# Patient Record
Sex: Female | Born: 1935 | Race: White | Hispanic: No | Smoking: Never smoker
Health system: Southern US, Community
[De-identification: ages and names within clinical notes are randomized; demographics above are authoritative.]

## PROBLEM LIST (undated history)

## (undated) DIAGNOSIS — G500 Trigeminal neuralgia: Secondary | ICD-10-CM

## (undated) DIAGNOSIS — M1990 Unspecified osteoarthritis, unspecified site: Secondary | ICD-10-CM

## (undated) DIAGNOSIS — N390 Urinary tract infection, site not specified: Secondary | ICD-10-CM

## (undated) DIAGNOSIS — F0390 Unspecified dementia without behavioral disturbance: Secondary | ICD-10-CM

## (undated) DIAGNOSIS — F0280 Dementia in other diseases classified elsewhere without behavioral disturbance: Secondary | ICD-10-CM

## (undated) DIAGNOSIS — I4891 Unspecified atrial fibrillation: Secondary | ICD-10-CM

## (undated) DIAGNOSIS — G309 Alzheimer's disease, unspecified: Secondary | ICD-10-CM

## (undated) HISTORY — PX: ABDOMINAL HYSTERECTOMY: SHX81

## (undated) HISTORY — PX: OTHER SURGICAL HISTORY: SHX169

## (undated) HISTORY — PX: BACK SURGERY: SHX140

---

## 1998-05-14 ENCOUNTER — Other Ambulatory Visit: Admission: RE | Admit: 1998-05-14 | Discharge: 1998-05-14 | Payer: Self-pay | Admitting: Internal Medicine

## 1999-08-13 ENCOUNTER — Ambulatory Visit (HOSPITAL_BASED_OUTPATIENT_CLINIC_OR_DEPARTMENT_OTHER): Admission: RE | Admit: 1999-08-13 | Discharge: 1999-08-13 | Payer: Self-pay | Admitting: Plastic Surgery

## 1999-09-30 ENCOUNTER — Encounter: Admission: RE | Admit: 1999-09-30 | Discharge: 1999-09-30 | Payer: Self-pay | Admitting: Family Medicine

## 1999-09-30 ENCOUNTER — Encounter: Payer: Self-pay | Admitting: Family Medicine

## 2001-03-10 ENCOUNTER — Encounter: Payer: Self-pay | Admitting: Gastroenterology

## 2001-03-10 ENCOUNTER — Encounter: Admission: RE | Admit: 2001-03-10 | Discharge: 2001-03-10 | Payer: Self-pay | Admitting: Gastroenterology

## 2001-03-24 ENCOUNTER — Encounter: Admission: RE | Admit: 2001-03-24 | Discharge: 2001-03-24 | Payer: Self-pay | Admitting: Family Medicine

## 2001-03-24 ENCOUNTER — Encounter: Payer: Self-pay | Admitting: Family Medicine

## 2001-04-09 ENCOUNTER — Encounter (INDEPENDENT_AMBULATORY_CARE_PROVIDER_SITE_OTHER): Payer: Self-pay | Admitting: *Deleted

## 2001-04-09 ENCOUNTER — Ambulatory Visit (HOSPITAL_COMMUNITY): Admission: RE | Admit: 2001-04-09 | Discharge: 2001-04-09 | Payer: Self-pay | Admitting: Gastroenterology

## 2003-08-01 ENCOUNTER — Encounter: Admission: RE | Admit: 2003-08-01 | Discharge: 2003-08-01 | Payer: Self-pay | Admitting: Family Medicine

## 2003-08-09 ENCOUNTER — Encounter: Admission: RE | Admit: 2003-08-09 | Discharge: 2003-08-09 | Payer: Self-pay | Admitting: Family Medicine

## 2003-12-14 ENCOUNTER — Emergency Department (HOSPITAL_COMMUNITY): Admission: EM | Admit: 2003-12-14 | Discharge: 2003-12-14 | Payer: Self-pay | Admitting: Family Medicine

## 2004-01-30 ENCOUNTER — Encounter: Admission: RE | Admit: 2004-01-30 | Discharge: 2004-01-30 | Payer: Self-pay | Admitting: Family Medicine

## 2004-09-11 ENCOUNTER — Encounter: Admission: RE | Admit: 2004-09-11 | Discharge: 2004-09-11 | Payer: Self-pay | Admitting: Family Medicine

## 2005-10-27 ENCOUNTER — Encounter: Admission: RE | Admit: 2005-10-27 | Discharge: 2005-10-27 | Payer: Self-pay | Admitting: Family Medicine

## 2007-04-03 ENCOUNTER — Emergency Department (HOSPITAL_COMMUNITY): Admission: EM | Admit: 2007-04-03 | Discharge: 2007-04-03 | Payer: Self-pay | Admitting: Emergency Medicine

## 2009-10-17 ENCOUNTER — Emergency Department (HOSPITAL_COMMUNITY): Admission: EM | Admit: 2009-10-17 | Discharge: 2009-10-17 | Payer: Self-pay | Admitting: Family Medicine

## 2010-07-22 ENCOUNTER — Emergency Department (HOSPITAL_COMMUNITY)
Admission: EM | Admit: 2010-07-22 | Discharge: 2010-07-22 | Payer: Self-pay | Source: Home / Self Care | Admitting: Family Medicine

## 2010-10-14 LAB — CBC
HCT: 32.5 % — ABNORMAL LOW (ref 36.0–46.0)
Hemoglobin: 11 g/dL — ABNORMAL LOW (ref 12.0–15.0)
RBC: 3.44 MIL/uL — ABNORMAL LOW (ref 3.87–5.11)
WBC: 6.6 10*3/uL (ref 4.0–10.5)

## 2010-12-06 NOTE — Procedures (Signed)
[place]. [facility]  Patient:    Jodi Beltran, Jodi Beltran Visit Number: [id number] MRN: [id number]          Service Type: END Location: ENDO Attending Physician:  Charna Elizabeth Dictated by:   Anselmo Rod, M.D. Proc. Date: 04/09/01 Admit Date:  04/09/2001   CC:         Dewain Penning, M.D.   Procedure Report  DATE OF BIRTH:  November 09, 1920  PROCEDURE PERFORMED:  Esophagogastroduodenoscopy with biopsies.  ENDOSCOPIST:  Anselmo Rod, M.D.  INSTRUMENT USED:  Olympus video panendoscope.  INDICATIONS FOR PROCEDURE:  Abnormal weight loss and abdominal pain in a 77 year old white female, rule out peptic ulcer disease, esophagitis, gastritis, etc.  PREPROCEDURE PREPARATION:  Informed consent was obtained from the patient. The patient was fasted for eight hours prior to the procedure.  PREPROCEDURE PHYSICAL EXAMINATION:  VITAL SIGNS:  Stable.  NECK:  Supple.  CHEST:  Clear to auscultation.  HEART:  S1 and S2 regular.  ABDOMEN:  Soft with normal bowel sounds.  DESCRIPTION OF PROCEDURE:  The patient was placed in the left lateral decubitus position and sedated with 60 mg of Demerol and 7 mg of Versed intravenously.  Once the patient was adequately sedated and maintained on low flow oxygen and continuous cardiac monitoring, the Olympus video panendoscope was advanced through the mouth piece over the tongue into the esophagus, and under direct vision the entire esophagus appeared normal without evidence of rings, strictures, masses, lesions, esophagitis, or Barretts mucosa.  The scope was then advanced in the stomach.  There was some nodularity seen at the GE junction that was biopsied.  There was some easy bleeding after two biopsies, and therefore no further biopsies were taken, suspecting this may be a ______.  The rest of the gastric mucosa and the proximal small bowel appeared normal.  IMPRESSION:  Essentially normal esophagogastroduodenoscopy  except for slight mucosal change at the GE junction, biopsied for pathology.  Two biopsies done. Some bleeding, which seemed to be controlled before the procedure was completed.  RECOMMENDATIONS:  Proceed with colonoscopy at this time. Dictated by:   Anselmo Rod, M.D. Attending Physician:  Charna Elizabeth DD:  04/09/01 TD:  04/09/01 Job: [id number]

## 2010-12-06 NOTE — Procedures (Signed)
[place]. [facility]  Patient:    GABRIAL, POPPELL Visit Number: [id number] MRN: [id number]          Service Type: END Location: ENDO Attending Physician:  Charna Elizabeth Dictated by:   Anselmo Rod, M.D. Proc. Date: 04/09/01 Admit Date:  04/09/2001   CC:         Tammy R. Collins Scotland, M.D.   Procedure Report  DATE OF BIRTH:  1921/04/20.  PROCEDURE:  Colonoscopy.  ENDOSCOPIST:  Anselmo Rod, M.D.  INSTRUMENT USED:  Olympus video colonoscope.  INDICATION FOR PROCEDURE:  Abnormal weight loss with left lower quadrant pain in a 77 year old white female.  Rule out colonic polyps, masses, hemorrhoids, etc.  PREPROCEDURE PREPARATION:  Informed consent was procured from the patient. The patient was fasted for eight hours prior to the procedure and prepped with a bottle of magnesium citrate and a gallon of NuLytely the night prior to the procedure.  PREPROCEDURE PHYSICAL:  VITAL SIGNS:  The patient had stable vital signs.  NECK:  Supple.  CHEST:  Clear to auscultation.  S1, S2 regular.  ABDOMEN:  Soft with normal bowel sounds.  DESCRIPTION OF PROCEDURE:  The patient was placed in the left lateral decubitus position and sedated with an additional 20 mg of Demerol and 1 mg of Versed intravenously.  Once the patient was adequately sedate and maintained on low-flow oxygen and continuous cardiac monitoring, the Olympus video colonoscope was advanced from the rectum to the cecum without difficulty. There was some residual stool in the colon.  The procedure was completed with ease.  There were left-sided diverticula present.  The appendiceal orifice and the ileocecal valve were clearly visualized and photographed.  No masses or polyps were seen.  There was no evidence of hemorrhoidal disease.  IMPRESSION:  Left-sided diverticulosis, otherwise unrevealing colonoscopy.  RECOMMENDATIONS: 1. CT scan of the abdomen and pelvis will be done if the  patient continues to    lose weight. 2. A small bowel follow-through will be considered at next office visit and    further recommendations made as deemed appropriate. Dictated by:   Anselmo Rod, M.D. Attending Physician:  Charna Elizabeth DD:  04/09/01 TD:  04/09/01 Job: [id number]

## 2012-09-13 ENCOUNTER — Emergency Department (HOSPITAL_COMMUNITY)
Admission: EM | Admit: 2012-09-13 | Discharge: 2012-09-13 | Disposition: A | Discharge status: Home / Self Care | Payer: Medicare Other | Attending: Emergency Medicine | Admitting: Emergency Medicine

## 2012-09-13 ENCOUNTER — Encounter (HOSPITAL_COMMUNITY): Payer: Self-pay | Admitting: *Deleted

## 2012-09-13 ENCOUNTER — Emergency Department (HOSPITAL_COMMUNITY): Payer: Medicare Other

## 2012-09-13 DIAGNOSIS — R4781 Slurred speech: Secondary | ICD-10-CM

## 2012-09-13 DIAGNOSIS — Z8669 Personal history of other diseases of the nervous system and sense organs: Secondary | ICD-10-CM | POA: Insufficient documentation

## 2012-09-13 DIAGNOSIS — R1310 Dysphagia, unspecified: Secondary | ICD-10-CM | POA: Insufficient documentation

## 2012-09-13 DIAGNOSIS — F0390 Unspecified dementia without behavioral disturbance: Secondary | ICD-10-CM | POA: Insufficient documentation

## 2012-09-13 DIAGNOSIS — J020 Streptococcal pharyngitis: Secondary | ICD-10-CM | POA: Insufficient documentation

## 2012-09-13 DIAGNOSIS — R4789 Other speech disturbances: Secondary | ICD-10-CM | POA: Insufficient documentation

## 2012-09-13 HISTORY — DX: Unspecified dementia, unspecified severity, without behavioral disturbance, psychotic disturbance, mood disturbance, and anxiety: F03.90

## 2012-09-13 HISTORY — DX: Trigeminal neuralgia: G50.0

## 2012-09-13 LAB — COMPREHENSIVE METABOLIC PANEL
Alkaline Phosphatase: 113 U/L (ref 39–117)
BUN: 17 mg/dL (ref 6–23)
Creatinine, Ser: 0.83 mg/dL (ref 0.50–1.10)
GFR calc Af Amer: 70 mL/min — ABNORMAL LOW (ref >90)
Glucose, Bld: 120 mg/dL — ABNORMAL HIGH (ref 70–99)
Potassium: 3.7 mEq/L (ref 3.5–5.1)
Total Protein: 7.3 g/dL (ref 6.0–8.3)

## 2012-09-13 LAB — CBC WITH DIFFERENTIAL/PLATELET
Eosinophils Absolute: 0 10*3/uL (ref 0.0–0.7)
Eosinophils Relative: 0 % (ref 0–5)
HCT: 35.3 % — ABNORMAL LOW (ref 36.0–46.0)
Hemoglobin: 12.2 g/dL (ref 12.0–15.0)
Lymphs Abs: 0.7 10*3/uL (ref 0.7–4.0)
MCH: 31.7 pg (ref 26.0–34.0)
MCHC: 34.6 g/dL (ref 30.0–36.0)
MCV: 91.7 fL (ref 78.0–100.0)
Monocytes Absolute: 0.8 10*3/uL (ref 0.1–1.0)
Monocytes Relative: 7 % (ref 3–12)
Neutrophils Relative %: 87 % — ABNORMAL HIGH (ref 43–77)
RBC: 3.85 MIL/uL — ABNORMAL LOW (ref 3.87–5.11)

## 2012-09-13 LAB — RAPID STREP SCREEN (MED CTR MEBANE ONLY): Streptococcus, Group A Screen (Direct): POSITIVE — AB

## 2012-09-13 MED ORDER — CEPHALEXIN 500 MG PO CAPS: 500.0000 mg | ORAL_CAPSULE | Freq: Four times a day (QID) | ORAL | Status: DC

## 2012-09-13 MED ORDER — IBUPROFEN 400 MG PO TABS
400.0000 mg | ORAL_TABLET | Freq: Once | ORAL | Status: AC
  Administered 2012-09-13: 400 mg via ORAL
  Filled 2012-09-13: qty 1

## 2012-09-13 MED ORDER — SODIUM CHLORIDE 0.9 % IV BOLUS (SEPSIS)
500.0000 mL | Freq: Once | INTRAVENOUS | Status: AC
  Administered 2012-09-13: 500 mL via INTRAVENOUS

## 2012-09-13 NOTE — ED Notes (Signed)
Son and daughter-in-law at bedside with patient.

## 2012-09-13 NOTE — ED Provider Notes (Signed)
History     CSN: [id number]  Arrival date & time 09/13/12  0916   First MD Initiated Contact with Patient 09/13/12 1016      Chief Complaint  Patient presents with  . Dysphagia    (Consider location/radiation/quality/duration/timing/severity/associated sxs/prior treatment) HPI Comments: Patient brought by family for eval of difficulty swallowing, voice changes that started yesterday.  She has a history of Dementia and adds little history.  Majority of the history is taken from the son at bedside.  Worse with eating or drinking.  No alleviating factors.  The son called the pcp who thought she should be checked for possible stroke.    The history is provided by the patient.    Past Medical History  Diagnosis Date  . Dementia   . Tic douloureux     Past Surgical History  Procedure Laterality Date  . Tumors removal benign    . Back surgery    . Bladder tact      No family history on file.  History  Substance Use Topics  . Smoking status: Never Smoker   . Smokeless tobacco: Not on file  . Alcohol Use: No    OB History   Grav Para Term Preterm Abortions TAB SAB Ect Mult Living                  Review of Systems  All other systems reviewed and are negative.    Allergies  Other  Home Medications  No current outpatient prescriptions on file.  BP 158/88  Pulse 109  Temp(Src) 99.1 F (37.3 C) (Oral)  Resp 18  SpO2 96%  Physical Exam  Nursing note and vitals reviewed. Constitutional: She is oriented to person, place, and time. She appears well-developed and well-nourished. No distress.  HENT:  Head: Normocephalic and atraumatic.  The PO is erythematous and uvula is swollen.  No exudates.  Eyes: EOM are normal. Pupils are equal, round, and reactive to light.  Neck: Normal range of motion. Neck supple.  Cardiovascular: Normal rate and regular rhythm.   No murmur heard. Pulmonary/Chest: Effort normal and breath sounds normal. No respiratory distress. She  has no wheezes.  Abdominal: Soft. Bowel sounds are normal. She exhibits no distension. There is no tenderness.  Musculoskeletal: Normal range of motion. She exhibits no edema.  Lymphadenopathy:    She has no cervical adenopathy.  Neurological: She is alert and oriented to person, place, and time. No cranial nerve deficit. She exhibits normal muscle tone. Coordination normal.  Skin: Skin is warm and dry. She is not diaphoretic.    ED Course  Procedures (including critical care time)  Labs Reviewed - No data to display No results found.   No diagnosis found.   Date: 09/13/2012  Rate: 86  Rhythm: normal sinus rhythm  QRS Axis: normal  Intervals: normal  ST/T Wave abnormalities: normal  Conduction Disutrbances:none  Narrative Interpretation:   Old EKG Reviewed: unchanged    MDM  The patient presents here with difficulty swallowing and speaking.  The family was concerned about a stroke.  The workup is negative for cva, however the po is swollen and erythematous.  Interestingly, the strep test was positive.  The family says she has been around the grandchildren at home.  This seems to fit the clinical picture and no other emergent pathology was found.  Will treat with keflex, follow up prn.        Geoffery Lyons, MD 09/13/12 1257

## 2012-09-13 NOTE — ED Notes (Signed)
PT is here with difficulty swallowing since yesterday and has continuous clearing of throat.  Pt denies pain.  Pt has dementia.  Pt has redness and swelling to right throat area

## 2012-09-20 ENCOUNTER — Observation Stay (HOSPITAL_COMMUNITY)
Admission: AD | Admit: 2012-09-20 | Discharge: 2012-09-22 | Disposition: A | Discharge status: Home / Self Care | Payer: Medicare Other | Source: Ambulatory Visit | Attending: Internal Medicine | Admitting: Internal Medicine

## 2012-09-20 ENCOUNTER — Encounter (HOSPITAL_COMMUNITY): Payer: Self-pay | Admitting: *Deleted

## 2012-09-20 DIAGNOSIS — H9190 Unspecified hearing loss, unspecified ear: Secondary | ICD-10-CM | POA: Insufficient documentation

## 2012-09-20 DIAGNOSIS — E860 Dehydration: Principal | ICD-10-CM | POA: Diagnosis present

## 2012-09-20 DIAGNOSIS — R3129 Other microscopic hematuria: Secondary | ICD-10-CM | POA: Insufficient documentation

## 2012-09-20 DIAGNOSIS — G309 Alzheimer's disease, unspecified: Secondary | ICD-10-CM | POA: Insufficient documentation

## 2012-09-20 DIAGNOSIS — F0280 Dementia in other diseases classified elsewhere without behavioral disturbance: Secondary | ICD-10-CM

## 2012-09-20 DIAGNOSIS — R1310 Dysphagia, unspecified: Secondary | ICD-10-CM | POA: Diagnosis present

## 2012-09-20 DIAGNOSIS — H9193 Unspecified hearing loss, bilateral: Secondary | ICD-10-CM

## 2012-09-20 DIAGNOSIS — M545 Low back pain: Secondary | ICD-10-CM

## 2012-09-20 DIAGNOSIS — R269 Unspecified abnormalities of gait and mobility: Secondary | ICD-10-CM | POA: Insufficient documentation

## 2012-09-20 DIAGNOSIS — J029 Acute pharyngitis, unspecified: Secondary | ICD-10-CM | POA: Diagnosis present

## 2012-09-20 DIAGNOSIS — G500 Trigeminal neuralgia: Secondary | ICD-10-CM | POA: Diagnosis present

## 2012-09-20 HISTORY — DX: Dementia in other diseases classified elsewhere, unspecified severity, without behavioral disturbance, psychotic disturbance, mood disturbance, and anxiety: F02.80

## 2012-09-20 HISTORY — DX: Alzheimer's disease, unspecified: G30.9

## 2012-09-20 HISTORY — DX: Unspecified osteoarthritis, unspecified site: M19.90

## 2012-09-20 LAB — COMPREHENSIVE METABOLIC PANEL
AST: 15 U/L (ref 0–37)
BUN: 21 mg/dL (ref 6–23)
CO2: 23 mEq/L (ref 19–32)
Chloride: 99 mEq/L (ref 96–112)
Creatinine, Ser: 0.79 mg/dL (ref 0.50–1.10)
GFR calc non Af Amer: 72 mL/min — ABNORMAL LOW (ref >90)
Total Bilirubin: 0.2 mg/dL — ABNORMAL LOW (ref 0.3–1.2)

## 2012-09-20 LAB — CBC
HCT: 32.4 % — ABNORMAL LOW (ref 36.0–46.0)
Hemoglobin: 11 g/dL — ABNORMAL LOW (ref 12.0–15.0)
MCV: 91.8 fL (ref 78.0–100.0)
RBC: 3.53 MIL/uL — ABNORMAL LOW (ref 3.87–5.11)
WBC: 7.6 10*3/uL (ref 4.0–10.5)

## 2012-09-20 MED ORDER — ADULT MULTIVITAMIN W/MINERALS CH
1.0000 | ORAL_TABLET | Freq: Every day | ORAL | Status: DC
  Administered 2012-09-20 – 2012-09-21 (×2): 1 via ORAL
  Filled 2012-09-20 (×3): qty 1

## 2012-09-20 MED ORDER — POTASSIUM CHLORIDE IN NACL 20-0.9 MEQ/L-% IV SOLN
INTRAVENOUS | Status: DC
  Administered 2012-09-20 – 2012-09-22 (×3): via INTRAVENOUS
  Filled 2012-09-20 (×6): qty 1000

## 2012-09-20 MED ORDER — ACETAMINOPHEN 650 MG RE SUPP: 650.0000 mg | Freq: Four times a day (QID) | RECTAL | Status: DC | PRN

## 2012-09-20 MED ORDER — MAGIC MOUTHWASH
5.0000 mL | Freq: Three times a day (TID) | ORAL | Status: DC
  Administered 2012-09-21 – 2012-09-22 (×5): 5 mL via ORAL
  Filled 2012-09-20 (×9): qty 5

## 2012-09-20 MED ORDER — ENOXAPARIN SODIUM 30 MG/0.3ML ~~LOC~~ SOLN
30.0000 mg | SUBCUTANEOUS | Status: DC
  Administered 2012-09-20 – 2012-09-21 (×2): 30 mg via SUBCUTANEOUS
  Filled 2012-09-20 (×3): qty 0.3

## 2012-09-20 MED ORDER — DONEPEZIL HCL 10 MG PO TABS
10.0000 mg | ORAL_TABLET | Freq: Every day | ORAL | Status: DC
  Administered 2012-09-20 – 2012-09-21 (×2): 10 mg via ORAL
  Filled 2012-09-20 (×4): qty 1

## 2012-09-20 MED ORDER — DEXTROSE 5 % IV SOLN
1.0000 g | INTRAVENOUS | Status: DC
  Administered 2012-09-20 – 2012-09-21 (×2): 1 g via INTRAVENOUS
  Filled 2012-09-20 (×4): qty 10

## 2012-09-20 MED ORDER — PROMETHAZINE HCL 25 MG PO TABS: 12.5000 mg | ORAL_TABLET | Freq: Four times a day (QID) | ORAL | Status: DC | PRN

## 2012-09-20 MED ORDER — ZOLPIDEM TARTRATE 5 MG PO TABS: 5.0000 mg | ORAL_TABLET | Freq: Every evening | ORAL | Status: DC | PRN

## 2012-09-20 MED ORDER — CARBAMAZEPINE 100 MG PO CHEW
100.0000 mg | CHEWABLE_TABLET | Freq: Three times a day (TID) | ORAL | Status: DC
  Administered 2012-09-20 – 2012-09-22 (×6): 100 mg via ORAL
  Filled 2012-09-20 (×9): qty 1

## 2012-09-20 MED ORDER — ALUM & MAG HYDROXIDE-SIMETH 200-200-20 MG/5ML PO SUSP: 30.0000 mL | Freq: Four times a day (QID) | ORAL | Status: DC | PRN

## 2012-09-20 MED ORDER — ACETAMINOPHEN 325 MG PO TABS: 650.0000 mg | ORAL_TABLET | Freq: Four times a day (QID) | ORAL | Status: DC | PRN

## 2012-09-20 MED ORDER — HYDROCODONE-ACETAMINOPHEN 5-325 MG PO TABS: 1.0000 | ORAL_TABLET | ORAL | Status: DC | PRN

## 2012-09-20 MED ORDER — BISACODYL 5 MG PO TBEC: 5.0000 mg | DELAYED_RELEASE_TABLET | Freq: Every day | ORAL | Status: DC | PRN

## 2012-09-20 MED ORDER — MEMANTINE HCL 10 MG PO TABS
10.0000 mg | ORAL_TABLET | Freq: Two times a day (BID) | ORAL | Status: DC
  Administered 2012-09-20 – 2012-09-21 (×3): 10 mg via ORAL
  Filled 2012-09-20 (×6): qty 1

## 2012-09-20 NOTE — H&P (Signed)
Jodi Beltran is an 79 y.o. female.   Chief Complaint: very weak and unable to swallow HPI:  The patient is an 79 year old Caucasian woman with  Several medical problems who presented to the emergency room on September 13, 2012 with a complaint of a very sore throat. She was rapid strep test positive and was sent home with a prescription for Keflex 500 mg qid. She has had difficulty taking the medicine and eating and drinking because of a very sore throat. She has become progressively more weak and presented to our office today for evaluation. In the office she was too weak too walk and could not open her mouth fully for examination, so she was directly admitted to observation status for IV fluids and IV antibiotic treatment.  She has not had fever or chills today, or nausea, vomiting, or diarrhea.  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   Past Medical History  Diagnosis Date  . Dementia   . Tic douloureux   . Arthritis   . Tic douloureux   . Alzheimer disease     Medications Prior to Admission  Medication Sig Dispense Refill  . B Complex Vitamins (VITAMIN B-COMPLEX PO) Take 1 capsule by mouth daily.      . carbamazepine (TEGRETOL) 100 MG chewable tablet Chew 100 mg by mouth every 4 (four) hours as needed. For pain      . cephALEXin (KEFLEX) 500 MG capsule Take 500 mg by mouth 4 (four) times daily. Started last Monday 09-13-12      . donepezil (ARICEPT) 10 MG tablet Take 10 mg by mouth every morning.      . memantine (NAMENDA) 10 MG tablet Take 10 mg by mouth 2 (two) times daily.      . [DISCONTINUED] cephALEXin (KEFLEX) 500 MG capsule Take 1 capsule (500 mg total) by mouth 4 (four) times daily.  28 capsule  0    ADDITIONAL HOME MEDICATIONS: see above list  PHYSICIANS INVOLVED IN CARE: Avie Echevaria (neurology), Ihor Gully (urology), Dossie Arbour (PCP)  Past Surgical History   Procedure Laterality Date  . Tumors removal benign    . Back surgery    . Bladder tact    . Abdominal hysterectomy      History reviewed. No pertinent family history.   Social History:  reports that she has never smoked. She does not have any smokeless tobacco history on file. She reports that she does not drink alcohol or use illicit drugs.  Allergies:  Allergies  Allergen Reactions  . Other Other (See Comments)    Maple- reaction unknown     ROS: arthritis, change in vision and weakness, sore throat, poor food and fluid intake, forgetfulness  PHYSICAL EXAM: Blood pressure 136/66, pulse 95, temperature 99.4 F (37.4 C), temperature source Oral, resp. rate 16, height 5\' 3"  (1.6 m), weight 53 kg (116 lb 13.5 oz), SpO2 96.00%. In general, the patient is a thin elderly white woman who looked fatigued. HEENT exam was limited because she could not open her mouth fully. Neck was without lymphadenopathy, JVD, or carotid bruit, chest was clear, heart had a regular rate and rhythm, abdomen had normal bowel sounds and no hepatosplenomegaly or tenderness, extremities were without edema. She was alert and could answer questions appropriately  Results for orders placed during the hospital encounter of 09/20/12 (from the past 48 hour(s))  CBC  Status: Abnormal   Collection Time    09/20/12  3:56 PM      Result Value Range   WBC 7.6  4.0 - 10.5 K/uL   RBC 3.53 (*) 3.87 - 5.11 MIL/uL   Hemoglobin 11.0 (*) 12.0 - 15.0 g/dL   HCT 24.4 (*) 01.0 - 27.2 %   MCV 91.8  78.0 - 100.0 fL   MCH 31.2  26.0 - 34.0 pg   MCHC 34.0  30.0 - 36.0 g/dL   RDW 53.6  64.4 - 03.4 %   Platelets 359  150 - 400 K/uL  COMPREHENSIVE METABOLIC PANEL     Status: Abnormal   Collection Time    09/20/12  3:56 PM      Result Value Range   Sodium 139  135 - 145 mEq/L   Potassium 3.8  3.5 - 5.1 mEq/L   Chloride 99  96 - 112 mEq/L   CO2 23  19 - 32 mEq/L   Glucose, Bld 76  70 - 99 mg/dL   BUN 21  6 - 23 mg/dL    Creatinine, Ser 7.42  0.50 - 1.10 mg/dL   Calcium 9.1  8.4 - 59.5 mg/dL   Total Protein 6.7  6.0 - 8.3 g/dL   Albumin 2.9 (*) 3.5 - 5.2 g/dL   AST 15  0 - 37 U/L   ALT 9  0 - 35 U/L   Alkaline Phosphatase 86  39 - 117 U/L   Total Bilirubin 0.2 (*) 0.3 - 1.2 mg/dL   GFR calc non Af Amer 72 (*) >90 mL/min   GFR calc Af Amer 83 (*) >90 mL/min   Comment:            The eGFR has been calculated     using the CKD EPI equation.     This calculation has not been     validated in all clinical     situations.     eGFR's persistently     <90 mL/min signify     possible Chronic Kidney Disease.   No results found.   Assessment/Plan #1 Dehydration and Weakness: from poor food and fluid intake from severe pharyngitis, so we will start IV fluids and IV antibiotics. We will also add magic mouthwash. I expect that she will only need 1-2 days of IV fluids and IV antibiotics.  #2 Alzheimers disease: stable on current meds.  PATERSON,DANIEL G 09/20/2012, 10:06 PM

## 2012-09-20 NOTE — Progress Notes (Signed)
Pt came from home with son with complaints of strep and difficulty swallowing. MD was paged to be made aware that pt was here and also for some orders. Admission hx and assessment was completed-----Peace Dormon, rn

## 2012-09-21 NOTE — Evaluation (Signed)
Physical Therapy Evaluation and Discharge Patient Details Name: Jodi Beltran MRN: [id number] DOB: 04-21-1921 Today's Date: 09/21/2012 Time: 4098-1191 PT Time Calculation (min): 24 min  PT Assessment / Plan / Recommendation Clinical Impression  Pt is a 79 yo female admitted for dehydration with known h/o dementia. Pt functioning close to baseline and is safe for d/c home with family and caregiver to provide 24/7 supervision due to mild decrease in activity tolerance and generalized weakness from having prolonged illness. Pt with good home set up and support. Pt with no further acute PT needs at this time. PT signing off. Please re-consult if needed in future.    PT Assessment  Patent does not need any further PT services    Follow Up Recommendations  No PT follow up;Supervision/Assistance - 24 hour    Does the patient have the potential to tolerate intense rehabilitation      Barriers to Discharge        Equipment Recommendations  None recommended by PT    Recommendations for Other Services     Frequency      Precautions / Restrictions Precautions Precautions: Fall Restrictions Weight Bearing Restrictions: No   Pertinent Vitals/Pain 0/10      Mobility  Bed Mobility Bed Mobility: Supine to Sit;Sit to Supine Supine to Sit: 4: Min assist;HOB flat Sit to Supine: 5: Supervision Details for Bed Mobility Assistance: assist for trunk elevation Transfers Transfers: Sit to Stand;Stand to Sit Sit to Stand: 4: Min guard;From bed;With upper extremity assist Stand to Sit: 4: Min guard;With upper extremity assist;To bed Details for Transfer Assistance: safe technique Ambulation/Gait Ambulation/Gait Assistance: 4: Min guard Ambulation Distance (Feet): 200 Feet Assistive device: None Ambulation/Gait Assistance Details: no obvious LOB Gait Pattern: Step-through pattern;Decreased stride length;Narrow base of support Gait velocity: per son pace slower than normal Stairs: No     Exercises     PT Diagnosis:    PT Problem List:   PT Treatment Interventions:     PT Goals Acute Rehab PT Goals PT Goal Formulation:  (n/a)  Visit Information  Last PT Received On: 09/21/12 Assistance Needed: +1    Subjective Data  Subjective: Pt received supine in bed. Son present to provide history.   Prior Functioning  Home Living Lives With: Alone Available Help at Discharge: Personal care attendant (6 hours, family very attententive as well visiting daily). Pt functioning at high level PTA. Pt with no need for DME. Type of Home: House Home Access: Stairs to enter [employer] of Steps: 2 Entrance Stairs-Rails: Can reach both Home Layout: One level Bathroom Shower/Tub: Tub/shower unit;Walk-in shower Bathroom Toilet: Standard Additional Comments: pt going to sons home at d/c Prior Function Level of Independence: Needs assistance Needs Assistance: Light Housekeeping (groceries) Light Housekeeping: Moderate Able to Take Stairs?: Yes Driving: No Vocation: Retired Comments: pt was able to work in her garden independently Communication Communication: No difficulties;HOH (wears hearing aides) Dominant Hand: Right    Cognition  Cognition Overall Cognitive Status: History of cognitive impairments - at baseline (h/o dementia) Arousal/Alertness: Awake/alert Orientation Level: Disoriented to;Time;Situation (but at baseline) Behavior During Session: [facility] for tasks performed    Extremity/Trunk Assessment Right Upper Extremity Assessment RUE ROM/Strength/Tone: [facility] for tasks assessed Left Upper Extremity Assessment LUE ROM/Strength/Tone: [facility] for tasks assessed Right Lower Extremity Assessment RLE ROM/Strength/Tone: [facility] for tasks assessed Left Lower Extremity Assessment LLE ROM/Strength/Tone: [facility] for tasks assessed Trunk Assessment Trunk Assessment: Normal   Balance Balance Balance Assessed: Yes Static Standing Balance Static Standing - Balance  Support: No  upper extremity supported Static Standing - Level of Assistance: 5: Stand by assistance Static Standing - Comment/# of Minutes: 5  End of Session PT - End of Session Equipment Utilized During Treatment: Gait belt Activity Tolerance: Patient tolerated treatment well Patient left: in bed;with call bell/phone within reach;with bed alarm set Nurse Communication: Mobility status (amb with patient later today)  GP Functional Assessment Tool Used: clinical judgement Functional Limitation: Mobility: Walking and moving around Mobility: Walking and Moving Around Current Status (W0981): At least 1 percent but less than 20 percent impaired, limited or restricted Mobility: Walking and Moving Around Goal Status [phone]): At least 1 percent but less than 20 percent impaired, limited or restricted Mobility: Walking and Moving Around Discharge Status [phone]): At least 1 percent but less than 20 percent impaired, limited or restricted   Marcene Brawn 09/21/2012, 1:18 PM

## 2012-09-21 NOTE — Progress Notes (Signed)
Occupational Therapy Discharge Patient Details Name: Jodi Beltran MRN: [id number] DOB: 1920-12-08 Today's Date: 09/21/2012 Time:  -     Patient discharged from OT services secondary at or near baseline per PT Ashly. Pt has 24/7 (A) for d/c home with caregivers and family support.  Please see latest therapy progress note for current level of functioning and progress toward goals.    Progress and discharge plan discussed with patient and/or caregiver: Patient unable to participate in discharge planning and no caregivers available  GO  OT screen    Lucile Shutters Pager: [phone]  09/21/2012, 2:30 PM

## 2012-09-21 NOTE — Progress Notes (Signed)
Subjective: Feeling a bit stronger with less sore throat and able to eat some food.   Objective: Vital signs in last 24 hours: Temp:  [97.9 F (36.6 C)-99.4 F (37.4 C)] 97.9 F (36.6 C) (03/04 0502) Pulse Rate:  [80-95] 85 (03/04 0502) Resp:  [16-20] 20 (03/04 0502) BP: (136-189)/(57-73) 164/57 mmHg (03/04 0502) SpO2:  [96 %-97 %] 97 % (03/04 0502) Weight:  [53 kg (116 lb 13.5 oz)] 53 kg (116 lb 13.5 oz) (03/03 1522) Weight change:    Intake/Output from previous day: 03/03 0701 - 03/04 0700 In: 360 [P.O.:360] Out: -    General appearance: alert, cooperative and no distress Resp: clear to auscultation bilaterally Cardio: regular rate and rhythm, S1, S2 normal, no murmur, click, rub or gallop GI: soft, non-tender; bowel sounds normal; no masses,  no organomegaly Extremities: extremities normal, atraumatic, no cyanosis or edema  Lab Results:  Recent Labs  09/20/12 1556  WBC 7.6  HGB 11.0*  HCT 32.4*  PLT 359   BMET  Recent Labs  09/20/12 1556  NA 139  K 3.8  CL 99  CO2 23  GLUCOSE 76  BUN 21  CREATININE 0.79  CALCIUM 9.1   CMET CMP     Component Value Date/Time   NA 139 09/20/2012 1556   K 3.8 09/20/2012 1556   CL 99 09/20/2012 1556   CO2 23 09/20/2012 1556   GLUCOSE 76 09/20/2012 1556   BUN 21 09/20/2012 1556   CREATININE 0.79 09/20/2012 1556   CALCIUM 9.1 09/20/2012 1556   PROT 6.7 09/20/2012 1556   ALBUMIN 2.9* 09/20/2012 1556   AST 15 09/20/2012 1556   ALT 9 09/20/2012 1556   ALKPHOS 86 09/20/2012 1556   BILITOT 0.2* 09/20/2012 1556   GFRNONAA 72* 09/20/2012 1556   GFRAA 83* 09/20/2012 1556    CBG (last 3)  No results found for this basename: GLUCAP,  in the last 72 hours  INR RESULTS:   No results found for this basename: INR, PROTIME     Studies/Results: No results found.  Medications: I have reviewed the patient's current medications.  Assessment/Plan: #1 Pharyngitis: improving with IV Rocephin. #2 Dehydration: improving with IVF #3 Gait Instability:  mild and we will have PT and OT evaluation today. Anticipate discharge to home tomorrow.    LOS: 1 day   PATERSON,DANIEL G 09/21/2012, 10:07 AM

## 2012-09-22 MED ORDER — ACETAMINOPHEN 325 MG PO TABS: 650.0000 mg | ORAL_TABLET | Freq: Four times a day (QID) | ORAL | Status: DC | PRN

## 2012-09-22 NOTE — Discharge Summary (Signed)
Physician Discharge Summary  Patient ID: Jodi Beltran MRN: [id number] DOB/AGE: 08/16/1920 79 y.o.  Admit date: 09/20/2012 Discharge date: 09/22/2012   Discharge Diagnoses:  Principal Problem:   Dehydration Active Problems:   Acute pharyngitis   Odynophagia   Tic douloureux   Discharged Condition: good  Hospital Course:  The patient is an 79 year old woman who was recently seen the emergency room with a sore throat. A rapid strep test was positive and she was discharged on Keflex. Unfortunately, she had continued very sore throat with decreased food and fluid intake and difficulty taking medications. She also had become significantly weak and lightheaded, so she was admitted to the hospital for IV fluids and IV antibiotics. She did well with this with rapid improvement in her overall function and ability to swallow and consumed food and fluids. On the day of discharge she was able to eat her breakfast without difficulty and was having normal bowel movements and no significant sore throat. She was seen by physical therapy and occupational therapy personnel who noted that she was independent with activities of daily living.  Consults: None  Significant Diagnostic Studies:  No results found.  Labs: Lab Results  Component Value Date   WBC 7.6 09/20/2012   HGB 11.0* 09/20/2012   HCT 32.4* 09/20/2012   MCV 91.8 09/20/2012   PLT 359 09/20/2012     Recent Labs Lab 09/20/12 1556  NA 139  K 3.8  CL 99  CO2 23  BUN 21  CREATININE 0.79  CALCIUM 9.1  PROT 6.7  BILITOT 0.2*  ALKPHOS 86  ALT 9  AST 15  GLUCOSE 76       No results found for this basename: INR, PROTIME     Recent Results (from the past 240 hour(s))  RAPID STREP SCREEN     Status: Abnormal   Collection Time    09/13/12 11:45 AM      Result Value Range Status   Streptococcus, Group A Screen (Direct) POSITIVE (*) NEGATIVE Final      Discharge Exam: Blood pressure 179/77, pulse 98, temperature 99.1 F (37.3 C),  temperature source Oral, resp. rate 16, height 5\' 3"  (1.6 m), weight 53 kg (116 lb 13.5 oz), SpO2 98.00%.  Physical Exam: In general, patient is a thin elderly white woman who was in no apparent distress while sitting in a chair. She was able to fully open her mouth now and did not have significant posterior oropharynx erythema. Chest was clear to auscultation, heart had a regular rate and rhythm, abdomen had normal bowel sounds no tenderness, extremities were without cyanosis, clubbing, or edema. She was alert and could answer questions well. She could walk without difficulty.  Disposition: She will be discharged from the hospital to home. She has family members that live close to her and will continue to assist her with her needs. She is advised to call our office today to schedule a followup visit in our office in about one week.  Discharge Orders   Future Orders Complete By Expires     Call MD for:  As directed     Comments:      Call physician for fever (temperature over 100.5 degrees, worsening sore throat, inability to swallow, diarrhea, or other concerning symptoms.    Diet - low sodium heart healthy  As directed     Discharge instructions  As directed     Comments:      Complete course of keflex antibiotic that you have at  home. Call our office today to set up a followup visit wtihin 1 week of discharge from the hospital.    Increase activity slowly  As directed         Medication List    TAKE these medications       acetaminophen 325 MG tablet  Commonly known as:  TYLENOL  Take 2 tablets (650 mg total) by mouth every 6 (six) hours as needed for pain or fever.     carbamazepine 100 MG chewable tablet  Commonly known as:  TEGRETOL  Chew 100 mg by mouth every 4 (four) hours as needed. For pain     cephALEXin 500 MG capsule  Commonly known as:  KEFLEX  Take 500 mg by mouth 4 (four) times daily. Started last Monday 09-13-12     donepezil 10 MG tablet  Commonly known as:   ARICEPT  Take 10 mg by mouth every morning.     memantine 10 MG tablet  Commonly known as:  NAMENDA  Take 10 mg by mouth 2 (two) times daily.     VITAMIN B-COMPLEX PO  Take 1 capsule by mouth daily.           Follow-up Information   Follow up with Garlan Fillers, MD. Schedule an appointment as soon as possible for a visit in 1 week.   Contact information:   [address] [facility] [place] [address] [phone]       Signed: Garlan Fillers 09/22/2012, 8:20 AM

## 2012-09-22 NOTE — Care Management Note (Signed)
    Page 1 of 1   09/22/2012     10:28:12 AM   CARE MANAGEMENT NOTE 09/22/2012  Patient:  Jodi Beltran, Jodi Beltran   Account Number:  [id number]  Date Initiated:  09/22/2012  Documentation initiated by:  [facility]  Subjective/Objective Assessment:   admited with dehydration     Action/Plan:   PT/OT evals-no follow up needs identified, patient has good family support   Anticipated DC Date:  09/22/2012   Anticipated DC Plan:  HOME/SELF CARE      DC Planning Services  CM consult      Choice offered to / List presented to:             Status of service:  Completed, signed off Medicare Important Message given?   (If response is "NO", the following Medicare IM given date fields will be blank) Date Medicare IM given:   Date Additional Medicare IM given:    Discharge Disposition:  HOME/SELF CARE  Per UR Regulation:  Reviewed for med. necessity/level of care/duration of stay  If discussed at Long Length of Stay Meetings, dates discussed:    Comments:

## 2012-11-10 ENCOUNTER — Other Ambulatory Visit: Payer: Self-pay

## 2012-11-10 MED ORDER — MEMANTINE HCL 10 MG PO TABS: 10.0000 mg | ORAL_TABLET | Freq: Two times a day (BID) | ORAL | Status: DC

## 2012-11-10 MED ORDER — DONEPEZIL HCL 10 MG PO TABS: 10.0000 mg | ORAL_TABLET | Freq: Every morning | ORAL | Status: DC

## 2012-11-10 NOTE — Telephone Encounter (Signed)
Former [facility] patient.  Has not been assigned new provider.  Auth refills via WID  

## 2013-01-04 ENCOUNTER — Inpatient Hospital Stay (HOSPITAL_COMMUNITY)
Admission: EM | Admit: 2013-01-04 | Discharge: 2013-01-07 | DRG: 482 | Disposition: A | Discharge status: Skilled Nursing Facility | Payer: Medicare Other | Attending: Internal Medicine | Admitting: Internal Medicine

## 2013-01-04 ENCOUNTER — Emergency Department (HOSPITAL_COMMUNITY): Payer: Medicare Other

## 2013-01-04 ENCOUNTER — Encounter (HOSPITAL_COMMUNITY): Payer: Self-pay | Admitting: Emergency Medicine

## 2013-01-04 DIAGNOSIS — M129 Arthropathy, unspecified: Secondary | ICD-10-CM | POA: Diagnosis present

## 2013-01-04 DIAGNOSIS — S72001S Fracture of unspecified part of neck of right femur, sequela: Secondary | ICD-10-CM

## 2013-01-04 DIAGNOSIS — R3129 Other microscopic hematuria: Secondary | ICD-10-CM

## 2013-01-04 DIAGNOSIS — G309 Alzheimer's disease, unspecified: Secondary | ICD-10-CM | POA: Diagnosis present

## 2013-01-04 DIAGNOSIS — S72033A Displaced midcervical fracture of unspecified femur, initial encounter for closed fracture: Principal | ICD-10-CM | POA: Diagnosis present

## 2013-01-04 DIAGNOSIS — H9190 Unspecified hearing loss, unspecified ear: Secondary | ICD-10-CM

## 2013-01-04 DIAGNOSIS — S72009C Fracture of unspecified part of neck of unspecified femur, initial encounter for open fracture type IIIA, IIIB, or IIIC: Secondary | ICD-10-CM

## 2013-01-04 DIAGNOSIS — M545 Low back pain: Secondary | ICD-10-CM

## 2013-01-04 DIAGNOSIS — J029 Acute pharyngitis, unspecified: Secondary | ICD-10-CM

## 2013-01-04 DIAGNOSIS — W010XXA Fall on same level from slipping, tripping and stumbling without subsequent striking against object, initial encounter: Secondary | ICD-10-CM | POA: Diagnosis present

## 2013-01-04 DIAGNOSIS — R1310 Dysphagia, unspecified: Secondary | ICD-10-CM

## 2013-01-04 DIAGNOSIS — E860 Dehydration: Secondary | ICD-10-CM

## 2013-01-04 DIAGNOSIS — F0390 Unspecified dementia without behavioral disturbance: Secondary | ICD-10-CM | POA: Diagnosis present

## 2013-01-04 DIAGNOSIS — I10 Essential (primary) hypertension: Secondary | ICD-10-CM | POA: Diagnosis present

## 2013-01-04 DIAGNOSIS — G500 Trigeminal neuralgia: Secondary | ICD-10-CM | POA: Diagnosis present

## 2013-01-04 DIAGNOSIS — S72001A Fracture of unspecified part of neck of right femur, initial encounter for closed fracture: Secondary | ICD-10-CM

## 2013-01-04 DIAGNOSIS — S72009A Fracture of unspecified part of neck of unspecified femur, initial encounter for closed fracture: Secondary | ICD-10-CM

## 2013-01-04 DIAGNOSIS — F0280 Dementia in other diseases classified elsewhere without behavioral disturbance: Secondary | ICD-10-CM | POA: Diagnosis present

## 2013-01-04 LAB — BASIC METABOLIC PANEL
BUN: 35 mg/dL — ABNORMAL HIGH (ref 6–23)
CO2: 27 mEq/L (ref 19–32)
Calcium: 9.2 mg/dL (ref 8.4–10.5)
Chloride: 106 mEq/L (ref 96–112)
Creatinine, Ser: 0.94 mg/dL (ref 0.50–1.10)
GFR calc Af Amer: 61 mL/min — ABNORMAL LOW (ref >90)
GFR calc non Af Amer: 52 mL/min — ABNORMAL LOW (ref >90)
Glucose, Bld: 127 mg/dL — ABNORMAL HIGH (ref 70–99)
Potassium: 4.6 mEq/L (ref 3.5–5.1)
Sodium: 141 mEq/L (ref 135–145)

## 2013-01-04 LAB — CBC
HCT: 34.9 % — ABNORMAL LOW (ref 36.0–46.0)
Hemoglobin: 11.6 g/dL — ABNORMAL LOW (ref 12.0–15.0)
MCH: 31.1 pg (ref 26.0–34.0)
MCHC: 33.2 g/dL (ref 30.0–36.0)
MCV: 93.6 fL (ref 78.0–100.0)
Platelets: 236 10*3/uL (ref 150–400)
RBC: 3.73 MIL/uL — ABNORMAL LOW (ref 3.87–5.11)
RDW: 13.6 % (ref 11.5–15.5)
WBC: 12.9 10*3/uL — ABNORMAL HIGH (ref 4.0–10.5)

## 2013-01-04 NOTE — ED Notes (Signed)
Skin tear scant bright red blood right elbow. Unknown injury.  Cleaned with wound cleanser and left open to air for PA to evaluate.  Patient tolerated without incident.

## 2013-01-04 NOTE — ED Notes (Signed)
Pt's son states the pt appears to be a little more confused than normal. Pt unsure of is she hit her head.

## 2013-01-04 NOTE — ED Notes (Signed)
Pt c/o rt hip pain. Pt's caregiver went out to get food and when she returned the pt was limping. Pt fell and injured her rt hip. Pt unsure if she hit her head, unable to remember the fall. No shortening of leg. Pt has dementia. Vitals 148/70 initial, 170/71, constant pain unable to rate pain, pain hurts only in movement. No allergies.

## 2013-01-05 ENCOUNTER — Encounter (HOSPITAL_COMMUNITY): Payer: Self-pay | Admitting: Anesthesiology

## 2013-01-05 ENCOUNTER — Inpatient Hospital Stay (HOSPITAL_COMMUNITY): Payer: Medicare Other | Admitting: Anesthesiology

## 2013-01-05 ENCOUNTER — Encounter (HOSPITAL_COMMUNITY)
Admission: EM | Disposition: A | Discharge status: Skilled Nursing Facility | Payer: Self-pay | Source: Home / Self Care | Attending: Internal Medicine

## 2013-01-05 ENCOUNTER — Inpatient Hospital Stay (HOSPITAL_COMMUNITY): Payer: Medicare Other

## 2013-01-05 ENCOUNTER — Encounter (HOSPITAL_COMMUNITY): Payer: Self-pay | Admitting: Internal Medicine

## 2013-01-05 DIAGNOSIS — S72009A Fracture of unspecified part of neck of unspecified femur, initial encounter for closed fracture: Secondary | ICD-10-CM

## 2013-01-05 DIAGNOSIS — I10 Essential (primary) hypertension: Secondary | ICD-10-CM

## 2013-01-05 DIAGNOSIS — S72009S Fracture of unspecified part of neck of unspecified femur, sequela: Secondary | ICD-10-CM

## 2013-01-05 DIAGNOSIS — F0280 Dementia in other diseases classified elsewhere without behavioral disturbance: Secondary | ICD-10-CM

## 2013-01-05 DIAGNOSIS — S72001A Fracture of unspecified part of neck of right femur, initial encounter for closed fracture: Secondary | ICD-10-CM

## 2013-01-05 HISTORY — PX: HIP PINNING,CANNULATED: SHX1758

## 2013-01-05 LAB — URINALYSIS, ROUTINE W REFLEX MICROSCOPIC
Bilirubin Urine: NEGATIVE
Glucose, UA: NEGATIVE mg/dL
Hgb urine dipstick: NEGATIVE
Ketones, ur: NEGATIVE mg/dL
Leukocytes, UA: NEGATIVE
Nitrite: NEGATIVE
Protein, ur: NEGATIVE mg/dL
Specific Gravity, Urine: 1.02 (ref 1.005–1.030)
Urobilinogen, UA: 0.2 mg/dL (ref 0.0–1.0)
pH: 5.5 (ref 5.0–8.0)

## 2013-01-05 LAB — BASIC METABOLIC PANEL
BUN: 28 mg/dL — ABNORMAL HIGH (ref 6–23)
CO2: 27 mEq/L (ref 19–32)
Calcium: 8.7 mg/dL (ref 8.4–10.5)
Chloride: 106 mEq/L (ref 96–112)
Creatinine, Ser: 0.88 mg/dL (ref 0.50–1.10)
GFR calc Af Amer: 66 mL/min — ABNORMAL LOW (ref >90)
GFR calc non Af Amer: 57 mL/min — ABNORMAL LOW (ref >90)
Glucose, Bld: 110 mg/dL — ABNORMAL HIGH (ref 70–99)
Potassium: 3.9 mEq/L (ref 3.5–5.1)
Sodium: 140 mEq/L (ref 135–145)

## 2013-01-05 LAB — CBC
Hemoglobin: 10.9 g/dL — ABNORMAL LOW (ref 12.0–15.0)
MCH: 30.8 pg (ref 26.0–34.0)
Platelets: 218 10*3/uL (ref 150–400)
RBC: 3.54 MIL/uL — ABNORMAL LOW (ref 3.87–5.11)
WBC: 8.9 10*3/uL (ref 4.0–10.5)

## 2013-01-05 LAB — SURGICAL PCR SCREEN
MRSA, PCR: NEGATIVE
Staphylococcus aureus: NEGATIVE

## 2013-01-05 LAB — TYPE AND SCREEN
ABO/RH(D): O POS
Antibody Screen: NEGATIVE

## 2013-01-05 LAB — ABO/RH: ABO/RH(D): O POS

## 2013-01-05 SURGERY — FIXATION, FEMUR, NECK, PERCUTANEOUS, USING SCREW
Anesthesia: General | Site: Hip | Laterality: Right | Wound class: Clean

## 2013-01-05 MED ORDER — BISACODYL 10 MG RE SUPP: 10.0000 mg | Freq: Every day | RECTAL | Status: DC | PRN

## 2013-01-05 MED ORDER — ROCURONIUM BROMIDE 100 MG/10ML IV SOLN
INTRAVENOUS | Status: DC | PRN
  Administered 2013-01-05: 25 mg via INTRAVENOUS

## 2013-01-05 MED ORDER — FENTANYL CITRATE 0.05 MG/ML IJ SOLN
INTRAMUSCULAR | Status: DC | PRN
  Administered 2013-01-05 (×4): 50 ug via INTRAVENOUS
  Administered 2013-01-05 (×2): 25 ug via INTRAVENOUS

## 2013-01-05 MED ORDER — DONEPEZIL HCL 10 MG PO TABS: 10.0000 mg | ORAL_TABLET | Freq: Every morning | ORAL | Status: DC

## 2013-01-05 MED ORDER — ONDANSETRON HCL 4 MG/2ML IJ SOLN
4.0000 mg | Freq: Four times a day (QID) | INTRAMUSCULAR | Status: DC | PRN
  Filled 2013-01-05: qty 2

## 2013-01-05 MED ORDER — GLYCOPYRROLATE 0.2 MG/ML IJ SOLN
INTRAMUSCULAR | Status: DC | PRN
  Administered 2013-01-05: 0.3 mg via INTRAVENOUS

## 2013-01-05 MED ORDER — HYDRALAZINE HCL 20 MG/ML IJ SOLN
20.0000 mg | Freq: Four times a day (QID) | INTRAMUSCULAR | Status: DC | PRN
  Administered 2013-01-06 (×2): 20 mg via INTRAVENOUS
  Filled 2013-01-05 (×2): qty 1

## 2013-01-05 MED ORDER — DOCUSATE SODIUM 100 MG PO CAPS: 100.0000 mg | ORAL_CAPSULE | Freq: Two times a day (BID) | ORAL | Status: DC

## 2013-01-05 MED ORDER — ACETAMINOPHEN 10 MG/ML IV SOLN
1000.0000 mg | Freq: Four times a day (QID) | INTRAVENOUS | Status: AC
  Administered 2013-01-05 – 2013-01-06 (×4): 1000 mg via INTRAVENOUS
  Filled 2013-01-05 (×4): qty 100

## 2013-01-05 MED ORDER — LIDOCAINE HCL (CARDIAC) 20 MG/ML IV SOLN
INTRAVENOUS | Status: DC | PRN
  Administered 2013-01-05: 40 mg via INTRAVENOUS

## 2013-01-05 MED ORDER — PROPOFOL 10 MG/ML IV BOLUS
INTRAVENOUS | Status: DC | PRN
  Administered 2013-01-05: 70 mg via INTRAVENOUS

## 2013-01-05 MED ORDER — POLYETHYLENE GLYCOL 3350 17 G PO PACK: 17.0000 g | PACK | Freq: Every day | ORAL | Status: DC | PRN

## 2013-01-05 MED ORDER — PHENOL 1.4 % MT LIQD: 1.0000 | OROMUCOSAL | Status: DC | PRN

## 2013-01-05 MED ORDER — MORPHINE SULFATE 2 MG/ML IJ SOLN
0.5000 mg | INTRAMUSCULAR | Status: DC | PRN
  Administered 2013-01-05 – 2013-01-06 (×2): 0.5 mg via INTRAVENOUS
  Filled 2013-01-05 (×2): qty 1

## 2013-01-05 MED ORDER — LACTATED RINGERS IV SOLN
INTRAVENOUS | Status: DC
  Administered 2013-01-06: 09:00:00 via INTRAVENOUS

## 2013-01-05 MED ORDER — CARBAMAZEPINE 100 MG PO CHEW: 100.0000 mg | CHEWABLE_TABLET | Freq: Every day | ORAL | Status: DC

## 2013-01-05 MED ORDER — LACTATED RINGERS IV SOLN
INTRAVENOUS | Status: DC | PRN
  Administered 2013-01-05: 19:00:00 via INTRAVENOUS

## 2013-01-05 MED ORDER — FLEET ENEMA 7-19 GM/118ML RE ENEM: 1.0000 | ENEMA | Freq: Once | RECTAL | Status: DC | PRN

## 2013-01-05 MED ORDER — SODIUM CHLORIDE 0.9 % IV SOLN
INTRAVENOUS | Status: DC
  Administered 2013-01-05: 05:00:00 via INTRAVENOUS

## 2013-01-05 MED ORDER — CEFAZOLIN SODIUM-DEXTROSE 2-3 GM-% IV SOLR
2.0000 g | Freq: Four times a day (QID) | INTRAVENOUS | Status: AC
  Administered 2013-01-05 – 2013-01-06 (×2): 2 g via INTRAVENOUS
  Filled 2013-01-05 (×2): qty 50

## 2013-01-05 MED ORDER — PHENYLEPHRINE HCL 10 MG/ML IJ SOLN
10.0000 mg | INTRAVENOUS | Status: DC | PRN
  Administered 2013-01-05: 40 ug/min via INTRAVENOUS

## 2013-01-05 MED ORDER — METOCLOPRAMIDE HCL 5 MG/ML IJ SOLN: 5.0000 mg | Freq: Three times a day (TID) | INTRAMUSCULAR | Status: DC | PRN

## 2013-01-05 MED ORDER — HYDROCODONE-ACETAMINOPHEN 5-325 MG PO TABS: 1.0000 | ORAL_TABLET | Freq: Four times a day (QID) | ORAL | Status: DC | PRN

## 2013-01-05 MED ORDER — MEMANTINE HCL 10 MG PO TABS: 10.0000 mg | ORAL_TABLET | Freq: Two times a day (BID) | ORAL | Status: DC

## 2013-01-05 MED ORDER — FENTANYL CITRATE 0.05 MG/ML IJ SOLN
100.0000 ug | Freq: Once | INTRAMUSCULAR | Status: AC
  Administered 2013-01-05: 100 ug via INTRAVENOUS
  Filled 2013-01-05: qty 2

## 2013-01-05 MED ORDER — HYDROCODONE-ACETAMINOPHEN 5-325 MG PO TABS
1.0000 | ORAL_TABLET | Freq: Four times a day (QID) | ORAL | Status: DC | PRN
  Administered 2013-01-05: 2 via ORAL
  Filled 2013-01-05: qty 2

## 2013-01-05 MED ORDER — MORPHINE SULFATE 2 MG/ML IJ SOLN: 0.5000 mg | INTRAMUSCULAR | Status: DC | PRN

## 2013-01-05 MED ORDER — MENTHOL 3 MG MT LOZG: 1.0000 | LOZENGE | OROMUCOSAL | Status: DC | PRN

## 2013-01-05 MED ORDER — ASPIRIN EC 325 MG PO TBEC
325.0000 mg | DELAYED_RELEASE_TABLET | Freq: Every day | ORAL | Status: DC
  Administered 2013-01-06 – 2013-01-07 (×2): 325 mg via ORAL
  Filled 2013-01-05 (×3): qty 1

## 2013-01-05 MED ORDER — CEFAZOLIN SODIUM-DEXTROSE 2-3 GM-% IV SOLR
INTRAVENOUS | Status: DC | PRN
  Administered 2013-01-05: 2 g via INTRAVENOUS

## 2013-01-05 MED ORDER — FENTANYL CITRATE 0.05 MG/ML IJ SOLN
25.0000 ug | INTRAMUSCULAR | Status: DC | PRN
  Administered 2013-01-05: 25 ug via INTRAVENOUS

## 2013-01-05 MED ORDER — METOCLOPRAMIDE HCL 10 MG PO TABS: 5.0000 mg | ORAL_TABLET | Freq: Three times a day (TID) | ORAL | Status: DC | PRN

## 2013-01-05 MED ORDER — 0.9 % SODIUM CHLORIDE (POUR BTL) OPTIME
TOPICAL | Status: DC | PRN
  Administered 2013-01-05: 1000 mL

## 2013-01-05 MED ORDER — METOPROLOL TARTRATE 1 MG/ML IV SOLN
2.5000 mg | Freq: Two times a day (BID) | INTRAVENOUS | Status: DC
  Administered 2013-01-05: 2.5 mg via INTRAVENOUS
  Filled 2013-01-05 (×2): qty 5

## 2013-01-05 MED ORDER — NEOSTIGMINE METHYLSULFATE 1 MG/ML IJ SOLN
INTRAMUSCULAR | Status: DC | PRN
  Administered 2013-01-05: 1.5 mg via INTRAVENOUS

## 2013-01-05 MED ORDER — ONDANSETRON HCL 4 MG PO TABS: 4.0000 mg | ORAL_TABLET | Freq: Four times a day (QID) | ORAL | Status: DC | PRN

## 2013-01-05 MED ORDER — METHOCARBAMOL 500 MG PO TABS
500.0000 mg | ORAL_TABLET | Freq: Four times a day (QID) | ORAL | Status: DC | PRN
  Administered 2013-01-05: 500 mg via ORAL

## 2013-01-05 MED ORDER — METHOCARBAMOL 100 MG/ML IJ SOLN: 500.0000 mg | Freq: Four times a day (QID) | INTRAVENOUS | Status: DC | PRN

## 2013-01-05 SURGICAL SUPPLY — 36 items
BANDAGE GAUZE ELAST BULKY 4 IN (GAUZE/BANDAGES/DRESSINGS) ×1 IMPLANT
BIT DRILL CANN LRG QC 5X300 (BIT) ×1 IMPLANT
BNDG COHESIVE 4X5 TAN STRL (GAUZE/BANDAGES/DRESSINGS) ×2 IMPLANT
CANISTER SUCTION 2500CC (MISCELLANEOUS) ×1 IMPLANT
CLOTH BEACON ORANGE TIMEOUT ST (SAFETY) ×2 IMPLANT
COVER SURGICAL LIGHT HANDLE (MISCELLANEOUS) ×2 IMPLANT
DRAPE STERI IOBAN 125X83 (DRAPES) ×2 IMPLANT
DRAPE U-SHAPE 47X51 STRL (DRAPES) ×2 IMPLANT
DRSG MEPILEX BORDER 4X4 (GAUZE/BANDAGES/DRESSINGS) ×1 IMPLANT
DRSG MEPILEX BORDER 4X8 (GAUZE/BANDAGES/DRESSINGS) ×1 IMPLANT
DRSG PAD ABDOMINAL 8X10 ST (GAUZE/BANDAGES/DRESSINGS) ×3 IMPLANT
ELECT REM PT RETURN 9FT ADLT (ELECTROSURGICAL) ×2
ELECTRODE REM PT RTRN 9FT ADLT (ELECTROSURGICAL) ×1 IMPLANT
GLOVE BIOGEL PI IND STRL 6.5 (GLOVE) ×1 IMPLANT
GLOVE BIOGEL PI IND STRL 8.5 (GLOVE) ×1 IMPLANT
GLOVE BIOGEL PI INDICATOR 6.5 (GLOVE)
GLOVE BIOGEL PI INDICATOR 8.5 (GLOVE) ×1
GLOVE ECLIPSE 6.0 STRL STRAW (GLOVE) ×1 IMPLANT
GLOVE ECLIPSE 8.5 STRL (GLOVE) ×2 IMPLANT
GOWN PREVENTION PLUS XXLARGE (GOWN DISPOSABLE) ×2 IMPLANT
GOWN STRL NON-REIN LRG LVL3 (GOWN DISPOSABLE) ×4 IMPLANT
GUIDEWIRE THREADED 2.8 (WIRE) ×3 IMPLANT
KIT BASIN OR (CUSTOM PROCEDURE TRAY) ×2 IMPLANT
KIT ROOM TURNOVER OR (KITS) ×2 IMPLANT
MANIFOLD NEPTUNE II (INSTRUMENTS) ×1 IMPLANT
NS IRRIG 1000ML POUR BTL (IV SOLUTION) ×2 IMPLANT
PACK GENERAL/GYN (CUSTOM PROCEDURE TRAY) ×2 IMPLANT
PAD ARMBOARD 7.5X6 YLW CONV (MISCELLANEOUS) ×4 IMPLANT
SCREW CANN 16 THRD/85 6.5 (Screw) ×2 IMPLANT
SCREW CANN 16 THRD/90 6.5 (Screw) ×1 IMPLANT
STAPLER VISISTAT 35W (STAPLE) ×1 IMPLANT
SUT VIC AB 1 CT1 27 (SUTURE) ×2
SUT VIC AB 1 CT1 27XBRD ANBCTR (SUTURE) IMPLANT
SUT VIC AB 2-0 CTB1 (SUTURE) ×1 IMPLANT
WASHER FOR 5.0 SCREWS (Washer) ×2 IMPLANT
WATER STERILE IRR 1000ML POUR (IV SOLUTION) ×2 IMPLANT

## 2013-01-05 NOTE — Progress Notes (Signed)
Orthopedic Tech Progress Note Patient Details:  Jodi Beltran 07-27-1920 [id number] Put on overhead frame     Jennye Moccasin 01/05/2013, 9:57 PM

## 2013-01-05 NOTE — Brief Op Note (Signed)
01/04/2013 - 01/05/2013  8:20 PM  PATIENT:  Jodi Beltran  79 y.o. female  PRE-OPERATIVE DIAGNOSIS:  non displaced sub cap fracture  POST-OPERATIVE DIAGNOSIS:  * No post-op diagnosis entered *  PROCEDURE:  Procedure(s): CANNULATED HIP PINNING (N/A)  SURGEON:  Surgeon(s) and Role:    * Venita Lick, MD - Primary  PHYSICIAN ASSISTANT:   ASSISTANTS: none   ANESTHESIA:   general  EBL:  Total I/O In: -  Out: 75 [Urine:75]  BLOOD ADMINISTERED:none  DRAINS: none   LOCAL MEDICATIONS USED:  none  SPECIMEN:  No Specimen  DISPOSITION OF SPECIMEN:  N/A  COUNTS:  YES  TOURNIQUET:  * No tourniquets in log *  DICTATION: .Other Dictation: Dictation Number [id number]  PLAN OF CARE: Admit to inpatient   PATIENT DISPOSITION:  PACU - hemodynamically stable.

## 2013-01-05 NOTE — ED Provider Notes (Signed)
History     CSN: [id number]  Arrival date & time 01/04/13  1702   First MD Initiated Contact with Patient 01/04/13 1706      Chief Complaint  Patient presents with  . Fall    (Consider location/radiation/quality/duration/timing/severity/associated sxs/prior treatment) HPI Patient presents to the emergency department following a fall with right hip pain.  Patient was at home alone when she apparently fell.  Patient is able to walk somewhat, but not without assistance.  Patient has dementia and is not able to afford history of a fall.  Patient denies chest pain, shortness of breath, headache, blurred vision, weakness, vomiting, or diarrhea.  Patient, states, that the pain, and right hip is constant, and is worse with movement and palpation Past Medical History  Diagnosis Date  . Dementia   . Tic douloureux   . Arthritis   . Tic douloureux   . Alzheimer disease     Past Surgical History  Procedure Laterality Date  . Tumors removal benign    . Back surgery    . Bladder tact    . Abdominal hysterectomy      History reviewed. No pertinent family history.  History  Substance Use Topics  . Smoking status: Never Smoker   . Smokeless tobacco: Not on file  . Alcohol Use: No    OB History   Grav Para Term Preterm Abortions TAB SAB Ect Mult Living                  Review of Systems Level V caveat applies due to dementia Allergies  Other  Home Medications   Current Outpatient Rx  Name  Route  Sig  Dispense  Refill  . carbamazepine (TEGRETOL) 100 MG chewable tablet   Oral   Chew 100 mg by mouth daily. For pain         . Cholecalciferol (VITAMIN D) 2000 UNITS CAPS   Oral   Take 1 capsule by mouth daily.         Marland Kitchen donepezil (ARICEPT) 10 MG tablet   Oral   Take 1 tablet (10 mg total) by mouth every morning.   90 tablet   1   . memantine (NAMENDA) 10 MG tablet   Oral   Take 1 tablet (10 mg total) by mouth 2 (two) times daily.   180 tablet   1     BP  169/55  Pulse 96  Temp(Src) 99 F (37.2 C) (Oral)  Resp 17  SpO2 98%  Physical Exam  Nursing note and vitals reviewed. Constitutional: She is oriented to person, place, and time. She appears well-developed and well-nourished.  HENT:  Head: Normocephalic and atraumatic.  Mouth/Throat: Oropharynx is clear and moist.  Eyes: Pupils are equal, round, and reactive to light.  Neck: Normal range of motion. Neck supple.  Cardiovascular: Normal rate, regular rhythm and normal heart sounds.  Exam reveals no gallop and no friction rub.   No murmur heard. Pulmonary/Chest: Effort normal and breath sounds normal. No respiratory distress.  Abdominal: Soft. Bowel sounds are normal. She exhibits no distension.  Musculoskeletal:       Right hip: She exhibits tenderness. She exhibits normal range of motion and no deformity.       Legs: Neurological: She is alert and oriented to person, place, and time.  Skin: Skin is warm and dry. No erythema.    ED Course  Procedures (including critical care time)  Labs Reviewed  BASIC METABOLIC PANEL - Abnormal; Notable for  the following:    Glucose, Bld 127 (*)    BUN 35 (*)    GFR calc non Af Amer 52 (*)    GFR calc Af Amer 61 (*)    All other components within normal limits  CBC - Abnormal; Notable for the following:    WBC 12.9 (*)    RBC 3.73 (*)    Hemoglobin 11.6 (*)    HCT 34.9 (*)    All other components within normal limits   Dg Hip Complete Right  01/04/2013   *RADIOLOGY REPORT*  Clinical Data: Fall, right hip pain  RIGHT HIP - COMPLETE 2+ VIEW  Comparison: None.  Findings: Hips are located.  There is a subtle cortical step off in the sub capital right femoral neck.  There this could represent a ring of osteophytosis but cannot exclude a sub capital nondisplaced fracture.  No evidence of pelvic fracture or sacral fracture  IMPRESSION: Cannot exclude a sub capital right femoral neck fracture.  Consider MRI or CT if continued clinical concern.    Original Report Authenticated By: Genevive Bi, M.D.   Ct Head Wo Contrast  01/04/2013   *RADIOLOGY REPORT*  Clinical Data:  Fall and injury to the right hip.  CT HEAD WITHOUT CONTRAST CT CERVICAL SPINE WITHOUT CONTRAST  Technique:  Multidetector CT imaging of the head and cervical spine was performed following the standard protocol without intravenous contrast.  Multiplanar CT image reconstructions of the cervical spine were also generated.  Comparison:  09/13/2012  CT HEAD  Findings: No evidence for acute hemorrhage, mass lesion, midline shift, hydrocephalus or large infarct.  There is mild cerebral atrophy.  Low density in the periventricular and subcortical white matter suggests chronic changes.  There may be a small lacune in the left internal capsule genu.  No acute bony abnormality.  IMPRESSION: No acute intracranial abnormality.  Atrophy and evidence of chronic small vessel ischemic changes.  CT CERVICAL SPINE  Findings: Small amount of fluid in the left mastoid air cells. Degenerative changes at C1 and C2.  Multilevel degenerative facet disease.  There is a left laminectomy defect at C6.  This appears chronic and could represent postsurgical change or a developmental finding.  Lung apices are clear.  Alignment of the cervical spine is within normal limits.  Normal alignment at the cervicothoracic junction.  There is no significant soft tissue swelling within the neck.  IMPRESSION: No acute bony abnormality in the cervical spine.  Multilevel cervical spondylosis.   Original Report Authenticated By: Richarda Overlie, M.D.   Ct Cervical Spine Wo Contrast  01/04/2013   *RADIOLOGY REPORT*  Clinical Data:  Fall and injury to the right hip.  CT HEAD WITHOUT CONTRAST CT CERVICAL SPINE WITHOUT CONTRAST  Technique:  Multidetector CT imaging of the head and cervical spine was performed following the standard protocol without intravenous contrast.  Multiplanar CT image reconstructions of the cervical spine were also  generated.  Comparison:  09/13/2012  CT HEAD  Findings: No evidence for acute hemorrhage, mass lesion, midline shift, hydrocephalus or large infarct.  There is mild cerebral atrophy.  Low density in the periventricular and subcortical white matter suggests chronic changes.  There may be a small lacune in the left internal capsule genu.  No acute bony abnormality.  IMPRESSION: No acute intracranial abnormality.  Atrophy and evidence of chronic small vessel ischemic changes.  CT CERVICAL SPINE  Findings: Small amount of fluid in the left mastoid air cells. Degenerative changes at C1 and C2.  Multilevel degenerative facet disease.  There is a left laminectomy defect at C6.  This appears chronic and could represent postsurgical change or a developmental finding.  Lung apices are clear.  Alignment of the cervical spine is within normal limits.  Normal alignment at the cervicothoracic junction.  There is no significant soft tissue swelling within the neck.  IMPRESSION: No acute bony abnormality in the cervical spine.  Multilevel cervical spondylosis.   Original Report Authenticated By: Richarda Overlie, M.D.   Ct Hip Right Wo Contrast  01/04/2013   *RADIOLOGY REPORT*  Clinical Data: Fall, limping  CT OF THE RIGHT HIP WITHOUT CONTRAST  Technique:  Multidetector CT imaging was performed according to the standard protocol. Multiplanar CT image reconstructions were also generated.  Comparison: Plain films of 01/04/2013  Findings: There is a small cortical step off within the subcapital right femoral neck seen on the coronal projection (image 27, series 104).  This is felt most likely to represent osteophytosis.  There is no clear evidence of fracture.  No evidence of hematoma in the joint.  No evidence of fracture in the right hemi pelvis.  IMPRESSION:  1.  No evidence displaced fracture.  Cannot completely exclude a nondisplaced occult fracture of which MRI is more sensitive.  If the patient continues to have hip pain  recommend  repeating radiographs.   Original Report Authenticated By: Genevive Bi, M.D.     1. Hip fracture requiring operative repair, right, closed, initial encounter     I spoke with orthopedics, in the, [facility] about admission for the patient.  Patient is to be n.p.o. after midnight.  Patient's family is made aware of the need for admission and surgery  MDM  MDM Reviewed: vitals and nursing note Interpretation: labs           Carlyle Dolly, PA-C 01/05/13 0030

## 2013-01-05 NOTE — Progress Notes (Signed)
[facility] PROGRESS NOTE  Jodi Beltran ZOX:[id number] DOB: [birth year] DOA: 01/04/2013 PCP: Garlan Fillers, MD  Assessment/Plan: Closed Right Hip Fracture -For repair today by Dr. Shon Baton. -Does complain of some right hip pain with movement. -Using metoprolol IV peri-operatively. -If BP remains elevated may switch to PO and keep on board.  Dementia -At baseline.  Code Status: Full code (discussed with son [name] at bedside and he would like the DNR reverted). Family Communication: Son [name].  Disposition Plan: OR later today for hip repair by Dr. Shon Baton.   Consultants:  Ortho, Dr. Shon Baton   Antibiotics:  None   Subjective: Complains of right hip pain with movement.  Objective: Filed Vitals:   01/05/13 0200 01/05/13 0257 01/05/13 0400 01/05/13 0609  BP: 141/52 156/51  180/51  Pulse: 81 73  81  Temp:  99.6 F (37.6 C)  100.3 F (37.9 C)  TempSrc:      Resp: 18 18 18 18   SpO2: 98% 99% 99% 99%   No intake or output data in the 24 hours ending 01/05/13 1107 There were no vitals filed for this visit.  Exam:   General:  AA  Cardiovascular: RRR  Respiratory: CTA B  Abdomen: S/NT/ND/+BS/no masses or organomegaly noted  Extremities: no C/C/E   Neurologic:  Non-focal  Data Reviewed: Basic Metabolic Panel:  Recent Labs Lab 01/04/13 1731 01/05/13 0520  NA 141 140  K 4.6 3.9  CL 106 106  CO2 27 27  GLUCOSE 127* 110*  BUN 35* 28*  CREATININE 0.94 0.88  CALCIUM 9.2 8.7   Liver Function Tests: No results found for this basename: AST, ALT, ALKPHOS, BILITOT, PROT, ALBUMIN,  in the last 168 hours No results found for this basename: LIPASE, AMYLASE,  in the last 168 hours No results found for this basename: AMMONIA,  in the last 168 hours CBC:  Recent Labs Lab 01/04/13 1731 01/05/13 0520  WBC 12.9* 8.9  HGB 11.6* 10.9*  HCT 34.9* 33.0*  MCV 93.6 93.2  PLT 236 218   Cardiac Enzymes: No results found for this basename: CKTOTAL, CKMB,  CKMBINDEX, TROPONINI,  in the last 168 hours BNP (last 3 results) No results found for this basename: PROBNP,  in the last 8760 hours CBG: No results found for this basename: GLUCAP,  in the last 168 hours  Recent Results (from the past 240 hour(s))  SURGICAL PCR SCREEN     Status: None   Collection Time    01/05/13  5:46 AM      Result Value Range Status   MRSA, PCR NEGATIVE  NEGATIVE Final   Staphylococcus aureus NEGATIVE  NEGATIVE Final   Comment:            The Xpert SA Assay (FDA     approved for NASAL specimens     in patients over 64 years of age),     is one component of     a comprehensive surveillance     program.  Test performance has     been validated by [employer] for patients greater     than or equal to 79 year old.     It is not intended     to diagnose infection nor to     guide or monitor treatment.     Studies: Dg Hip Complete Right  01/04/2013   *RADIOLOGY REPORT*  Clinical Data: Fall, right hip pain  RIGHT HIP - COMPLETE 2+ VIEW  Comparison: None.  Findings: Hips are located.  There is a subtle cortical step off in the sub capital right femoral neck.  There this could represent a ring of osteophytosis but cannot exclude a sub capital nondisplaced fracture.  No evidence of pelvic fracture or sacral fracture  IMPRESSION: Cannot exclude a sub capital right femoral neck fracture.  Consider MRI or CT if continued clinical concern.   Original Report Authenticated By: Genevive Bi, M.D.   Ct Head Wo Contrast  01/04/2013   *RADIOLOGY REPORT*  Clinical Data:  Fall and injury to the right hip.  CT HEAD WITHOUT CONTRAST CT CERVICAL SPINE WITHOUT CONTRAST  Technique:  Multidetector CT imaging of the head and cervical spine was performed following the standard protocol without intravenous contrast.  Multiplanar CT image reconstructions of the cervical spine were also generated.  Comparison:  09/13/2012  CT HEAD  Findings: No evidence for acute hemorrhage, mass  lesion, midline shift, hydrocephalus or large infarct.  There is mild cerebral atrophy.  Low density in the periventricular and subcortical white matter suggests chronic changes.  There may be a small lacune in the left internal capsule genu.  No acute bony abnormality.  IMPRESSION: No acute intracranial abnormality.  Atrophy and evidence of chronic small vessel ischemic changes.  CT CERVICAL SPINE  Findings: Small amount of fluid in the left mastoid air cells. Degenerative changes at C1 and C2.  Multilevel degenerative facet disease.  There is a left laminectomy defect at C6.  This appears chronic and could represent postsurgical change or a developmental finding.  Lung apices are clear.  Alignment of the cervical spine is within normal limits.  Normal alignment at the cervicothoracic junction.  There is no significant soft tissue swelling within the neck.  IMPRESSION: No acute bony abnormality in the cervical spine.  Multilevel cervical spondylosis.   Original Report Authenticated By: Richarda Overlie, M.D.   Ct Cervical Spine Wo Contrast  01/04/2013   *RADIOLOGY REPORT*  Clinical Data:  Fall and injury to the right hip.  CT HEAD WITHOUT CONTRAST CT CERVICAL SPINE WITHOUT CONTRAST  Technique:  Multidetector CT imaging of the head and cervical spine was performed following the standard protocol without intravenous contrast.  Multiplanar CT image reconstructions of the cervical spine were also generated.  Comparison:  09/13/2012  CT HEAD  Findings: No evidence for acute hemorrhage, mass lesion, midline shift, hydrocephalus or large infarct.  There is mild cerebral atrophy.  Low density in the periventricular and subcortical white matter suggests chronic changes.  There may be a small lacune in the left internal capsule genu.  No acute bony abnormality.  IMPRESSION: No acute intracranial abnormality.  Atrophy and evidence of chronic small vessel ischemic changes.  CT CERVICAL SPINE  Findings: Small amount of fluid in the  left mastoid air cells. Degenerative changes at C1 and C2.  Multilevel degenerative facet disease.  There is a left laminectomy defect at C6.  This appears chronic and could represent postsurgical change or a developmental finding.  Lung apices are clear.  Alignment of the cervical spine is within normal limits.  Normal alignment at the cervicothoracic junction.  There is no significant soft tissue swelling within the neck.  IMPRESSION: No acute bony abnormality in the cervical spine.  Multilevel cervical spondylosis.   Original Report Authenticated By: Richarda Overlie, M.D.   Ct Hip Right Wo Contrast  01/04/2013   *RADIOLOGY REPORT*  Clinical Data: Fall, limping  CT OF THE RIGHT HIP WITHOUT CONTRAST  Technique:  Multidetector CT  imaging was performed according to the standard protocol. Multiplanar CT image reconstructions were also generated.  Comparison: Plain films of 01/04/2013  Findings: There is a small cortical step off within the subcapital right femoral neck seen on the coronal projection (image 27, series 104).  This is felt most likely to represent osteophytosis.  There is no clear evidence of fracture.  No evidence of hematoma in the joint.  No evidence of fracture in the right hemi pelvis.  IMPRESSION:  1.  No evidence displaced fracture.  Cannot completely exclude a nondisplaced occult fracture of which MRI is more sensitive.  If the patient continues to have hip pain  recommend repeating radiographs.   Original Report Authenticated By: Genevive Bi, M.D.   Mr Hip Right Wo Contrast  01/05/2013   *RADIOLOGY REPORT*  Clinical Data: Right hip pain.  MRI OF THE RIGHT HIP WITHOUT CONTRAST  Technique:  Multiplanar, multisequence MR imaging was performed. No intravenous contrast was administered.  Comparison: CT scan 01/04/2013.  Findings: There is a nondisplaced subcapital fracture of the right hip.  There is an associated hip joint effusion.  Mild hip joint degenerative changes bilaterally.  The pubic  symphysis and SI joints are intact.  No pelvic fractures.  No significant intrapelvic abnormalities are demonstrated.  No inguinal mass or hernia.  The surrounding hip and pelvic musculature appear normal except for mild muscle strain involving the obturator externus.  IMPRESSION: Nondisplaced subcapital fracture of the right hip.   Original Report Authenticated By: Rudie Meyer, M.D.   Dg Chest Portable 1 View  01/05/2013   *RADIOLOGY REPORT*  Clinical Data: Preoperative evaluation for hip surgery  PORTABLE CHEST - 1 VIEW  Comparison: None.  Findings: The heart and pulmonary vascularity are within normal limits.  The lungs are clear bilaterally.  No acute bony abnormality is noted.  IMPRESSION: No acute abnormalities seen.   Original Report Authenticated By: Alcide Clever, M.D.    Scheduled Meds: . [START ON 01/06/2013] carbamazepine  100 mg Oral Daily  . docusate sodium  100 mg Oral BID  . [START ON 01/06/2013] donepezil  10 mg Oral q morning - 10a  . [START ON 01/06/2013] memantine  10 mg Oral BID  . metoprolol  2.5 mg Intravenous Q12H   Continuous Infusions: . sodium chloride 75 mL/hr at 01/05/13 1478    Principal Problem:   Closed right hip fracture Active Problems:   Tic douloureux   Alzheimer's disease   Hypertension   Hip fracture requiring operative repair    Time spent: 35 minutes.    Chaya Jan  [facility] Pager [phone]  If 7PM-7AM, please contact night-coverage at [email or web address], password [facility] 01/05/2013, 11:07 AM  LOS: 1 day

## 2013-01-05 NOTE — H&P (Signed)
PCP:  Garlan Fillers, MD  Neurology: used to be Dr. [facility]  Chief Complaint:  Hip pain  HPI: Jodi Beltran is a 79 y.o. female   has a past medical history of Dementia; Tic douloureux; Arthritis; Tic douloureux; and Alzheimer disease.   Presented with  Patient tripped over a blanket and fell while care giver has stepped out. When her family came to check on her she was limping. She started to have sever hip pain and was brought to ER. Initial plain films and CT scan was unremarkable MRI did show a fracture. Orthopedics have been consulted with plan to operate in AM. Hospitalis called for an admission. She had no CAD, has no exertional symptoms with ambulation.    Review of Systems:   Pertinent positives include: hip pain  Constitutional:  No weight loss, night sweats, Fevers, chills, fatigue, weight loss  HEENT:  No headaches, Difficulty swallowing,Tooth/dental problems,Sore throat,  No sneezing, itching, ear ache, nasal congestion, post nasal drip,  Cardio-vascular:  No chest pain, Orthopnea, PND, anasarca, dizziness, palpitations.no Bilateral lower extremity swelling  GI:  No heartburn, indigestion, abdominal pain, nausea, vomiting, diarrhea, change in bowel habits, loss of appetite, melena, blood in stool, hematemesis Resp:  no shortness of breath at rest. No dyspnea on exertion, No excess mucus, no productive cough, No non-productive cough, No coughing up of blood.No change in color of mucus.No wheezing. Skin:  no rash or lesions. No jaundice GU:  no dysuria, change in color of urine, no urgency or frequency. No straining to urinate.  No flank pain.  Musculoskeletal:  No joint pain or no joint swelling. No decreased range of motion. No back pain.  Psych:  No change in mood or affect. No depression or anxiety. No memory loss.  Neuro: no localizing neurological complaints, no tingling, no weakness, no double vision, no gait abnormality, no slurred speech, no  confusion  Otherwise ROS are negative except for above, 10 systems were reviewed  Past Medical History: Past Medical History  Diagnosis Date  . Dementia   . Tic douloureux   . Arthritis   . Tic douloureux   . Alzheimer disease    Past Surgical History  Procedure Laterality Date  . Tumors removal benign    . Back surgery    . Bladder tact    . Abdominal hysterectomy       Medications: Prior to Admission medications   Medication Sig Start Date End Date Taking? Authorizing Provider  carbamazepine (TEGRETOL) 100 MG chewable tablet Chew 100 mg by mouth daily. For pain   Yes Historical Provider, MD  Cholecalciferol (VITAMIN D) 2000 UNITS CAPS Take 1 capsule by mouth daily.   Yes Historical Provider, MD  donepezil (ARICEPT) 10 MG tablet Take 1 tablet (10 mg total) by mouth every morning. 11/10/12  Yes Huston Foley, MD  memantine (NAMENDA) 10 MG tablet Take 1 tablet (10 mg total) by mouth 2 (two) times daily. 11/10/12  Yes Huston Foley, MD    Allergies:   Allergies  Allergen Reactions  . Other Other (See Comments)    Maple- reaction unknown    Social History:  Ambulatory  independently   Lives at   Home with family or caregiver   reports that she has never smoked. She does not have any smokeless tobacco history on file. She reports that she does not drink alcohol or use illicit drugs.   Family History: family history includes Lung cancer in her mother.    Physical  Exam: Patient Vitals for the past 24 hrs:  BP Temp Temp src Pulse Resp SpO2  01/05/13 0257 156/51 mmHg 99.6 F (37.6 C) - 73 18 99 %  01/05/13 0200 141/52 mmHg - - 81 18 98 %  01/05/13 0100 155/48 mmHg - - 85 20 92 %  01/05/13 0000 168/53 mmHg - - 83 16 94 %  01/04/13 2348 169/55 mmHg - - - 17 -  01/04/13 2145 183/69 mmHg - - 96 20 98 %  01/04/13 2000 174/65 mmHg - - 88 - 93 %  01/04/13 1900 179/60 mmHg - - 85 18 95 %  01/04/13 1800 190/66 mmHg - - 93 18 97 %  01/04/13 1730 179/58 mmHg - - 89 18 95 %   01/04/13 1715 153/69 mmHg - - 86 18 95 %  01/04/13 1712 160/84 mmHg 99 F (37.2 C) Oral 78 20 99 %    1. General:  in No Acute distress 2. Psychological: Alert and  Oriented to situation, place but not date 3. Head/ENT:   Moist  Mucous Membranes                          Head Non traumatic, neck supple                          Normal Dentition 4. SKIN: normal  Skin turgor,  Skin clean Dry and intact no rash 5. Heart: Regular rate and rhythm no Murmur, Rub or gallop 6. Lungs: Clear to auscultation bilaterally, no wheezes or crackles   7. Abdomen: Soft, non-tender, Non distended 8. Lower extremities: no clubbing, cyanosis, or edema 9. Neurologically Grossly intact, moving all 4 extremities equally 10. MSK: Normal range of motion  body mass index is unknown because there is no weight on file.   Labs on Admission:   Recent Labs  01/04/13 1731  NA 141  K 4.6  CL 106  CO2 27  GLUCOSE 127*  BUN 35*  CREATININE 0.94  CALCIUM 9.2   No results found for this basename: AST, ALT, ALKPHOS, BILITOT, PROT, ALBUMIN,  in the last 72 hours No results found for this basename: LIPASE, AMYLASE,  in the last 72 hours  Recent Labs  01/04/13 1731  WBC 12.9*  HGB 11.6*  HCT 34.9*  MCV 93.6  PLT 236   No results found for this basename: CKTOTAL, CKMB, CKMBINDEX, TROPONINI,  in the last 72 hours No results found for this basename: TSH, T4TOTAL, FREET3, T3FREE, THYROIDAB,  in the last 72 hours No results found for this basename: VITAMINB12, FOLATE, FERRITIN, TIBC, IRON, RETICCTPCT,  in the last 72 hours No results found for this basename: HGBA1C    The CrCl is unknown because both a height and weight (above a minimum accepted value) are required for this calculation. ABG No results found for this basename: phart, pco2, po2, hco3, tco2, acidbasedef, o2sat     No results found for this basename: DDIMER     Other results:  I have pearsonaly reviewed this: ECG REPORT  Rate: 82   Rhythm: NSR no ischemic changes ST&T Change: no ischemic changes  Cultures: No results found for this basename: sdes, specrequest, cult, reptstatus       Radiological Exams on Admission: Dg Hip Complete Right  01/04/2013   *RADIOLOGY REPORT*  Clinical Data: Fall, right hip pain  RIGHT HIP - COMPLETE 2+ VIEW  Comparison: None.  Findings: Hips  are located.  There is a subtle cortical step off in the sub capital right femoral neck.  There this could represent a ring of osteophytosis but cannot exclude a sub capital nondisplaced fracture.  No evidence of pelvic fracture or sacral fracture  IMPRESSION: Cannot exclude a sub capital right femoral neck fracture.  Consider MRI or CT if continued clinical concern.   Original Report Authenticated By: Genevive Bi, M.D.   Ct Head Wo Contrast  01/04/2013   *RADIOLOGY REPORT*  Clinical Data:  Fall and injury to the right hip.  CT HEAD WITHOUT CONTRAST CT CERVICAL SPINE WITHOUT CONTRAST  Technique:  Multidetector CT imaging of the head and cervical spine was performed following the standard protocol without intravenous contrast.  Multiplanar CT image reconstructions of the cervical spine were also generated.  Comparison:  09/13/2012  CT HEAD  Findings: No evidence for acute hemorrhage, mass lesion, midline shift, hydrocephalus or large infarct.  There is mild cerebral atrophy.  Low density in the periventricular and subcortical white matter suggests chronic changes.  There may be a small lacune in the left internal capsule genu.  No acute bony abnormality.  IMPRESSION: No acute intracranial abnormality.  Atrophy and evidence of chronic small vessel ischemic changes.  CT CERVICAL SPINE  Findings: Small amount of fluid in the left mastoid air cells. Degenerative changes at C1 and C2.  Multilevel degenerative facet disease.  There is a left laminectomy defect at C6.  This appears chronic and could represent postsurgical change or a developmental finding.  Lung apices  are clear.  Alignment of the cervical spine is within normal limits.  Normal alignment at the cervicothoracic junction.  There is no significant soft tissue swelling within the neck.  IMPRESSION: No acute bony abnormality in the cervical spine.  Multilevel cervical spondylosis.   Original Report Authenticated By: Richarda Overlie, M.D.   Ct Cervical Spine Wo Contrast  01/04/2013   *RADIOLOGY REPORT*  Clinical Data:  Fall and injury to the right hip.  CT HEAD WITHOUT CONTRAST CT CERVICAL SPINE WITHOUT CONTRAST  Technique:  Multidetector CT imaging of the head and cervical spine was performed following the standard protocol without intravenous contrast.  Multiplanar CT image reconstructions of the cervical spine were also generated.  Comparison:  09/13/2012  CT HEAD  Findings: No evidence for acute hemorrhage, mass lesion, midline shift, hydrocephalus or large infarct.  There is mild cerebral atrophy.  Low density in the periventricular and subcortical white matter suggests chronic changes.  There may be a small lacune in the left internal capsule genu.  No acute bony abnormality.  IMPRESSION: No acute intracranial abnormality.  Atrophy and evidence of chronic small vessel ischemic changes.  CT CERVICAL SPINE  Findings: Small amount of fluid in the left mastoid air cells. Degenerative changes at C1 and C2.  Multilevel degenerative facet disease.  There is a left laminectomy defect at C6.  This appears chronic and could represent postsurgical change or a developmental finding.  Lung apices are clear.  Alignment of the cervical spine is within normal limits.  Normal alignment at the cervicothoracic junction.  There is no significant soft tissue swelling within the neck.  IMPRESSION: No acute bony abnormality in the cervical spine.  Multilevel cervical spondylosis.   Original Report Authenticated By: Richarda Overlie, M.D.   Ct Hip Right Wo Contrast  01/04/2013   *RADIOLOGY REPORT*  Clinical Data: Fall, limping  CT OF THE RIGHT  HIP WITHOUT CONTRAST  Technique:  Multidetector CT imaging was  performed according to the standard protocol. Multiplanar CT image reconstructions were also generated.  Comparison: Plain films of 01/04/2013  Findings: There is a small cortical step off within the subcapital right femoral neck seen on the coronal projection (image 27, series 104).  This is felt most likely to represent osteophytosis.  There is no clear evidence of fracture.  No evidence of hematoma in the joint.  No evidence of fracture in the right hemi pelvis.  IMPRESSION:  1.  No evidence displaced fracture.  Cannot completely exclude a nondisplaced occult fracture of which MRI is more sensitive.  If the patient continues to have hip pain  recommend repeating radiographs.   Original Report Authenticated By: Genevive Bi, M.D.    Chart has been reviewed  Assessment/Plan  79 yo With Hip fracture and hx of dementia  Present on Admission:  Hip fracture - as per orthopedics. Scheduled to be repaired in OR in a.m. will order chest x-ray for preop clearance.  Patient has no history of coronary disease and does well with ambulation without signs of shortness of breath chest pain. No further cardiac workup indicated at this time. Given elderly age will order preoperative metoprolol.  . Hypertension - metoprolol IV can be changed to by mouth once patient able to tolerate  . Tic douloureux - continue Tegretol and check a level   Prophylaxis: SCD    CODE STATUS: DNR/DNI per family and patient   Other plan as per orders.  I have spent a total of 55 min on this admission  Bertie Simien 01/05/2013, 3:10 AM

## 2013-01-05 NOTE — ED Provider Notes (Signed)
Medical screening examination/treatment/procedure(s) were conducted as a shared visit with non-physician practitioner(s) and myself.  I personally evaluated the patient during the encounter  Mechanical fall with R hip pain. Family found her limping.  Demented and doesn't recall fall. No shortening or external rotation. +2 DP and PT pulses  Glynn Octave, MD 01/05/13 [phone]

## 2013-01-05 NOTE — Op Note (Signed)
Jodi Beltran, KNAPIK                ACCOUNT NO.:  [id number]  MEDICAL RECORD NO.:  [id number]  LOCATION:  [id number]                        FACILITY:  [facility]  PHYSICIAN:  Alvy Beal, MD    DATE OF BIRTH:  April 12, 1921  DATE OF PROCEDURE:  01/05/2013 DATE OF DISCHARGE:                              OPERATIVE REPORT   PREOPERATIVE DIAGNOSIS:  Nondisplaced subcapital impacted femoral neck fracture.  POSTOPERATIVE DIAGNOSIS:  Nondisplaced subcapital impacted femoral neck fracture.  OPERATIVE PROCEDURE:  Closed reduction, internal fixation with cannulated screws on the right hip.  HISTORY:  This is a very pleasant elderly woman who fell yesterday and complained of hip pain and inability to ambulate.  X-rays were unremarkable with the MRI showed edema consistent with a nondisplaced subcapital hip fracture.  As a result, I discussed treatment options with the patient's family.  Due to her underlying Alzheimer's, consent was provided by her son who has healthcare proxy.  After discussing risks, benefits, her consented to surgery.  All appropriate risks, benefits, and alternatives were discussed with the patient.  OPERATIVE NOTE:  The patient was brought to the operating room, placed supine on the operating table.  After successful induction of general anesthesia and endotracheal intubation, the right lower extremity was placed into the traction boot, and the left into the well leg holder. The patient's leg was placed in the neutral position.  X-rays confirmed that there was no displacement during the setup of the case.  The right lower extremity which was marked preoperatively was prepped and draped in standard fashion.  Time-out was taken to confirm patient, procedure, and affected extremity.  Once this was done, a small incision was made on the lateral aspect of the femur and a guide pin was placed on the lateral aspect of the femoral cortex.  This was done above the level of the lesser  troch.  I then advanced the guide pin into the femoral neck and femoral head.  I confirmed trajectory in position in both the AP and lateral planes.  Once I confirmed this, I used the Gatling gun guide and placed the 2 remaining guide pins.  This was placed in a triangular pattern with 1 screw inferiorly, 2 screws superiorly.  I then drilled the outer cortex, and in the inferior placed a 90 mm long 6.5 cannulated screw and in the 2 superiors, I placed a 5 mm length screws both with washers for added compression.  All screws had excellent purchase into the bone.  I then irrigated the wound copiously with normal saline, removed the guide pins and took final x-rays in both planes, which were satisfactory.  The wound was irrigated, closed in a layered fashion with interrupted #1 Vicryl suture, 2-0 Vicryl sutures, and staples.  Dry dressing was applied.  The patient was extubated, transferred to PACU without incident.  At the end of the case, all needle and sponge counts were correct.     Alvy Beal, MD    DDB/MEDQ  D:  01/05/2013  T:  01/05/2013  Job:  [id number]

## 2013-01-05 NOTE — Consult Note (Signed)
Garlan Fillers, MD Chief Complaint: Right hip fracture History: Jodi Beltran is a 79 y.o. female  has a past medical history of Dementia; Tic douloureux; Arthritis; Tic douloureux; and Alzheimer disease.  Presented with  Patient tripped over a blanket and fell while care giver has stepped out. When her family came to check on her she was limping. She started to have sever hip pain and was brought to ER. Initial plain films and CT scan was unremarkable MRI did show a fracture. Orthopedics have been consulted with plan to operate in AM. [facility] called for an admission. She had no CAD, has no exertional symptoms with ambulation.     Past Medical History  Diagnosis Date  . Dementia   . Tic douloureux   . Arthritis   . Tic douloureux   . Alzheimer disease     Allergies  Allergen Reactions  . Other Other (See Comments)    Maple- reaction unknown    No current facility-administered medications on file prior to encounter.   Current Outpatient Prescriptions on File Prior to Encounter  Medication Sig Dispense Refill  . carbamazepine (TEGRETOL) 100 MG chewable tablet Chew 100 mg by mouth daily. For pain      . donepezil (ARICEPT) 10 MG tablet Take 1 tablet (10 mg total) by mouth every morning.  90 tablet  1  . memantine (NAMENDA) 10 MG tablet Take 1 tablet (10 mg total) by mouth 2 (two) times daily.  180 tablet  1    Physical Exam: Filed Vitals:   01/05/13 0609  BP: 180/51  Pulse: 81  Temp: 100.3 F (37.9 C)  Resp: 18  NO SOB/CP abd soft/nt Compartments soft/nt 1+ DP/PT pulses Right hip/groin pain. No abrasion/laceration EHL/TA/GA intact   Image: Dg Hip Complete Right  01/04/2013   *RADIOLOGY REPORT*  Clinical Data: Fall, right hip pain  RIGHT HIP - COMPLETE 2+ VIEW  Comparison: None.  Findings: Hips are located.  There is a subtle cortical step off in the sub capital right femoral neck.  There this could represent a ring of osteophytosis but cannot exclude a sub  capital nondisplaced fracture.  No evidence of pelvic fracture or sacral fracture  IMPRESSION: Cannot exclude a sub capital right femoral neck fracture.  Consider MRI or CT if continued clinical concern.   Original Report Authenticated By: Genevive Bi, M.D.   Ct Head Wo Contrast  01/04/2013   *RADIOLOGY REPORT*  Clinical Data:  Fall and injury to the right hip.  CT HEAD WITHOUT CONTRAST CT CERVICAL SPINE WITHOUT CONTRAST  Technique:  Multidetector CT imaging of the head and cervical spine was performed following the standard protocol without intravenous contrast.  Multiplanar CT image reconstructions of the cervical spine were also generated.  Comparison:  09/13/2012  CT HEAD  Findings: No evidence for acute hemorrhage, mass lesion, midline shift, hydrocephalus or large infarct.  There is mild cerebral atrophy.  Low density in the periventricular and subcortical white matter suggests chronic changes.  There may be a small lacune in the left internal capsule genu.  No acute bony abnormality.  IMPRESSION: No acute intracranial abnormality.  Atrophy and evidence of chronic small vessel ischemic changes.  CT CERVICAL SPINE  Findings: Small amount of fluid in the left mastoid air cells. Degenerative changes at C1 and C2.  Multilevel degenerative facet disease.  There is a left laminectomy defect at C6.  This appears chronic and could represent postsurgical change or a developmental finding.  Lung apices are clear.  Alignment of the cervical spine is within normal limits.  Normal alignment at the cervicothoracic junction.  There is no significant soft tissue swelling within the neck.  IMPRESSION: No acute bony abnormality in the cervical spine.  Multilevel cervical spondylosis.   Original Report Authenticated By: Richarda Overlie, M.D.   Ct Cervical Spine Wo Contrast  01/04/2013   *RADIOLOGY REPORT*  Clinical Data:  Fall and injury to the right hip.  CT HEAD WITHOUT CONTRAST CT CERVICAL SPINE WITHOUT CONTRAST   Technique:  Multidetector CT imaging of the head and cervical spine was performed following the standard protocol without intravenous contrast.  Multiplanar CT image reconstructions of the cervical spine were also generated.  Comparison:  09/13/2012  CT HEAD  Findings: No evidence for acute hemorrhage, mass lesion, midline shift, hydrocephalus or large infarct.  There is mild cerebral atrophy.  Low density in the periventricular and subcortical white matter suggests chronic changes.  There may be a small lacune in the left internal capsule genu.  No acute bony abnormality.  IMPRESSION: No acute intracranial abnormality.  Atrophy and evidence of chronic small vessel ischemic changes.  CT CERVICAL SPINE  Findings: Small amount of fluid in the left mastoid air cells. Degenerative changes at C1 and C2.  Multilevel degenerative facet disease.  There is a left laminectomy defect at C6.  This appears chronic and could represent postsurgical change or a developmental finding.  Lung apices are clear.  Alignment of the cervical spine is within normal limits.  Normal alignment at the cervicothoracic junction.  There is no significant soft tissue swelling within the neck.  IMPRESSION: No acute bony abnormality in the cervical spine.  Multilevel cervical spondylosis.   Original Report Authenticated By: Richarda Overlie, M.D.   Ct Hip Right Wo Contrast  01/04/2013   *RADIOLOGY REPORT*  Clinical Data: Fall, limping  CT OF THE RIGHT HIP WITHOUT CONTRAST  Technique:  Multidetector CT imaging was performed according to the standard protocol. Multiplanar CT image reconstructions were also generated.  Comparison: Plain films of 01/04/2013  Findings: There is a small cortical step off within the subcapital right femoral neck seen on the coronal projection (image 27, series 104).  This is felt most likely to represent osteophytosis.  There is no clear evidence of fracture.  No evidence of hematoma in the joint.  No evidence of fracture in  the right hemi pelvis.  IMPRESSION:  1.  No evidence displaced fracture.  Cannot completely exclude a nondisplaced occult fracture of which MRI is more sensitive.  If the patient continues to have hip pain  recommend repeating radiographs.   Original Report Authenticated By: Genevive Bi, M.D.   Mr Hip Right Wo Contrast  01/05/2013   *RADIOLOGY REPORT*  Clinical Data: Right hip pain.  MRI OF THE RIGHT HIP WITHOUT CONTRAST  Technique:  Multiplanar, multisequence MR imaging was performed. No intravenous contrast was administered.  Comparison: CT scan 01/04/2013.  Findings: There is a nondisplaced subcapital fracture of the right hip.  There is an associated hip joint effusion.  Mild hip joint degenerative changes bilaterally.  The pubic symphysis and SI joints are intact.  No pelvic fractures.  No significant intrapelvic abnormalities are demonstrated.  No inguinal mass or hernia.  The surrounding hip and pelvic musculature appear normal except for mild muscle strain involving the obturator externus.  IMPRESSION: Nondisplaced subcapital fracture of the right hip.   Original Report Authenticated By: Rudie Meyer, M.D.   Dg Chest Portable 1 View  01/05/2013   *  RADIOLOGY REPORT*  Clinical Data: Preoperative evaluation for hip surgery  PORTABLE CHEST - 1 VIEW  Comparison: None.  Findings: The heart and pulmonary vascularity are within normal limits.  The lungs are clear bilaterally.  No acute bony abnormality is noted.  IMPRESSION: No acute abnormalities seen.   Original Report Authenticated By: Alcide Clever, M.D.    A/P: Patient s/p fall with right hip fracture seen on MRI.  No significant displacement.  Patient with pain that prohibits ambulation. Plan on cannulated screw fixation of the right hip today Reviewed all risks/benefits with pt and her son (power of attorney) Plan on surgery today.

## 2013-01-05 NOTE — Preoperative (Signed)
Beta Blockers   Reason not to administer Beta Blockers:given 10 am 6/18

## 2013-01-05 NOTE — Transfer of Care (Signed)
Immediate Anesthesia Transfer of Care Note  Patient: Jodi Beltran  Procedure(s) Performed: Procedure(s): CANNULATED HIP PINNING (N/A)  Patient Location: PACU  Anesthesia Type:General  Level of Consciousness: awake  Airway & Oxygen Therapy: Patient Spontanous Breathing  Post-op Assessment: Report given to PACU RN and Post -op Vital signs reviewed and stable  Post vital signs: Reviewed and stable  Complications: No apparent anesthesia complications

## 2013-01-05 NOTE — Anesthesia Procedure Notes (Addendum)
Procedure Name: Intubation Date/Time: 01/05/2013 7:29 PM Performed by: Alanda Amass A Pre-anesthesia Checklist: Patient identified, Timeout performed, Emergency Drugs available, Suction available and Patient being monitored Patient Re-evaluated:Patient Re-evaluated prior to inductionOxygen Delivery Method: Circle system utilized Preoxygenation: Pre-oxygenation with 100% oxygen Intubation Type: IV induction Ventilation: Mask ventilation without difficulty Laryngoscope Size: Mac and 3 Grade View: Grade III Tube type: Oral Tube size: 7.0 mm Number of attempts: 1 Airway Equipment and Method: Stylet Placement Confirmation: ETT inserted through vocal cords under direct vision,  breath sounds checked- equal and bilateral and positive ETCO2 Secured at: 20 cm Tube secured with: Tape Dental Injury: Teeth and Oropharynx as per pre-operative assessment

## 2013-01-05 NOTE — Anesthesia Preprocedure Evaluation (Addendum)
Anesthesia Evaluation  Patient identified by MRN, date of birth, ID band Patient awake and Patient confused  General Assessment Comment:dementia  Reviewed: Allergy & Precautions, H&P , NPO status , Patient's Chart, lab work & pertinent test results  Airway Mallampati: I TM Distance: >3 FB Neck ROM: Full    Dental  (+) Teeth Intact and Dental Advisory Given   Pulmonary neg pulmonary ROS,  breath sounds clear to auscultation        Cardiovascular hypertension, Pt. on medications Rhythm:Regular Rate:Normal     Neuro/Psych Anxiety    GI/Hepatic negative GI ROS, Neg liver ROS,   Endo/Other  negative endocrine ROS  Renal/GU negative Renal ROS     Musculoskeletal   Abdominal   Peds  Hematology   Anesthesia Other Findings   Reproductive/Obstetrics                          Anesthesia Physical Anesthesia Plan  ASA: III  Anesthesia Plan: General   Post-op Pain Management:    Induction: Intravenous  Airway Management Planned: Oral ETT  Additional Equipment:   Intra-op Plan:   Post-operative Plan: Possible Post-op intubation/ventilation  Informed Consent: I have reviewed the patients History and Physical, chart, labs and discussed the procedure including the risks, benefits and alternatives for the proposed anesthesia with the patient or authorized representative who has indicated his/her understanding and acceptance.   Dental advisory given  Plan Discussed with: CRNA, Anesthesiologist and Surgeon  Anesthesia Plan Comments:         Anesthesia Quick Evaluation

## 2013-01-05 NOTE — Anesthesia Postprocedure Evaluation (Signed)
  Anesthesia Post-op Note  Patient: Jodi Beltran  Procedure(s) Performed: Procedure(s): CANNULATED HIP PINNING RIGHT HIP (Right)  Patient Location: PACU  Anesthesia Type:General  Level of Consciousness: awake  Airway and Oxygen Therapy: Patient Spontanous Breathing  Post-op Pain: mild  Post-op Assessment: Post-op Vital signs reviewed  Post-op Vital Signs: Reviewed  Complications: No apparent anesthesia complications

## 2013-01-06 LAB — URINE CULTURE: Colony Count: NO GROWTH

## 2013-01-06 LAB — BASIC METABOLIC PANEL
BUN: 18 mg/dL (ref 6–23)
Chloride: 101 mEq/L (ref 96–112)
Creatinine, Ser: 0.7 mg/dL (ref 0.50–1.10)
GFR calc Af Amer: 86 mL/min — ABNORMAL LOW (ref >90)
GFR calc non Af Amer: 75 mL/min — ABNORMAL LOW (ref >90)
Potassium: 3.8 mEq/L (ref 3.5–5.1)

## 2013-01-06 LAB — CBC
MCHC: 33.7 g/dL (ref 30.0–36.0)
MCV: 93.1 fL (ref 78.0–100.0)
Platelets: 184 10*3/uL (ref 150–400)
RDW: 13.6 % (ref 11.5–15.5)
WBC: 9.7 10*3/uL (ref 4.0–10.5)

## 2013-01-06 MED ORDER — CARBAMAZEPINE 100 MG PO CHEW
100.0000 mg | CHEWABLE_TABLET | Freq: Every day | ORAL | Status: DC
  Administered 2013-01-06 – 2013-01-07 (×2): 100 mg via ORAL
  Filled 2013-01-06 (×2): qty 1

## 2013-01-06 MED ORDER — MEMANTINE HCL 10 MG PO TABS
10.0000 mg | ORAL_TABLET | Freq: Two times a day (BID) | ORAL | Status: DC
  Administered 2013-01-06 – 2013-01-07 (×3): 10 mg via ORAL
  Filled 2013-01-06 (×4): qty 1

## 2013-01-06 MED ORDER — TRAMADOL HCL 50 MG PO TABS
50.0000 mg | ORAL_TABLET | Freq: Three times a day (TID) | ORAL | Status: DC | PRN
  Administered 2013-01-06: 50 mg via ORAL
  Filled 2013-01-06: qty 1

## 2013-01-06 MED ORDER — DONEPEZIL HCL 10 MG PO TABS
10.0000 mg | ORAL_TABLET | Freq: Every morning | ORAL | Status: DC
  Administered 2013-01-06 – 2013-01-07 (×2): 10 mg via ORAL
  Filled 2013-01-06 (×2): qty 1

## 2013-01-06 NOTE — Clinical Social Work Placement (Addendum)
Clinical Social Work Department  CLINICAL SOCIAL WORK PLACEMENT NOTE  01/06/2013  Patient: Jodi Beltran Account Number: [id number] Admit date: 01/04/13  Clinical Social Worker: Sabino Niemann MSW Date/time: 01/06/2013 1:00 PM  Clinical Social Work is seeking post-discharge placement for this patient at the following level of care: SKILLED NURSING (*CSW will update this form in Epic as items are completed)  01/06/2013 Patient/family provided with [facility] Department of Clinical Social Work's list of facilities offering this level of care within the geographic area requested by the patient (or if unable, by the patient's family).  01/06/2013 Patient/family informed of their freedom to choose among providers that offer the needed level of care, that participate in Medicare, Medicaid or managed care program needed by the patient, have an available bed and are willing to accept the patient.  6/19/2014Patient/family informed of [facility]' ownership interest in [facility], as well as of the fact that they are under no obligation to receive care at this facility.  PASARR submitted to EDS on 01/07/2013 PASARR number received from [employer] on 01/07/2013 FL2 transmitted to all facilities in geographic area requested by pt/family on 01/06/2013  FL2 transmitted to all facilities within larger geographic area on  Patient informed that his/her managed care company has contracts with or will negotiate with certain facilities, including the following:  Patient/family informed of bed offers received: 01/07/2013 Patient chooses bed at [facility] Physician recommends and patient chooses bed at  Patient to be transferred to on 01/07/2013 Patient to be transferred to facility by [facility] The following physician request were entered in Epic:  Additional Comments:

## 2013-01-06 NOTE — Progress Notes (Signed)
[facility] PROGRESS NOTE  Jodi Beltran ZOX:[id number] DOB: [birth year] DOA: 01/04/2013 PCP: Garlan Fillers, MD  Assessment/Plan: Closed Right Hip Fracture -s/p repair by Dr. Shon Baton. -PT/OT recs pending.  Dementia -At baseline.  Code Status: Full code  Family Communication: Son and daughter at bedside. Disposition Plan: Await PT/OT recs; suspect will need CIR/SNF.   Consultants:  Ortho, Dr. Shon Baton   Antibiotics:  None   Subjective: No complaints today.  Objective: Filed Vitals:   01/06/13 0020 01/06/13 0200 01/06/13 0548 01/06/13 1400  BP: 189/60 148/43 157/54 152/50  Pulse: 88 89 95 80  Temp: 98.4 F (36.9 C) 98.8 F (37.1 C) 98.8 F (37.1 C) 99.1 F (37.3 C)  TempSrc:      Resp: 15 16 16 16   SpO2: 98% 96% 98% 95%    Intake/Output Summary (Last 24 hours) at 01/06/13 1536 Last data filed at 01/06/13 0600  Gross per 24 hour  Intake    675 ml  Output   1525 ml  Net   -850 ml   There were no vitals filed for this visit.  Exam:   General:  AA  Cardiovascular: RRR  Respiratory: CTA B  Abdomen: S/NT/ND/+BS/no masses or organomegaly noted  Extremities: no C/C/E   Neurologic:  Non-focal  Data Reviewed: Basic Metabolic Panel:  Recent Labs Lab 01/04/13 1731 01/05/13 0520 01/06/13 0430  NA 141 140 137  K 4.6 3.9 3.8  CL 106 106 101  CO2 27 27 23   GLUCOSE 127* 110* 116*  BUN 35* 28* 18  CREATININE 0.94 0.88 0.70  CALCIUM 9.2 8.7 8.5   Liver Function Tests: No results found for this basename: AST, ALT, ALKPHOS, BILITOT, PROT, ALBUMIN,  in the last 168 hours No results found for this basename: LIPASE, AMYLASE,  in the last 168 hours No results found for this basename: AMMONIA,  in the last 168 hours CBC:  Recent Labs Lab 01/04/13 1731 01/05/13 0520 01/06/13 0430  WBC 12.9* 8.9 9.7  HGB 11.6* 10.9* 10.5*  HCT 34.9* 33.0* 31.2*  MCV 93.6 93.2 93.1  PLT 236 218 184   Cardiac Enzymes: No results found for this basename:  CKTOTAL, CKMB, CKMBINDEX, TROPONINI,  in the last 168 hours BNP (last 3 results) No results found for this basename: PROBNP,  in the last 8760 hours CBG: No results found for this basename: GLUCAP,  in the last 168 hours  Recent Results (from the past 240 hour(s))  URINE CULTURE     Status: None   Collection Time    01/05/13  5:44 AM      Result Value Range Status   Specimen Description URINE, CATHETERIZED   Final   Special Requests NONE   Final   Culture  Setup Time 01/05/2013 11:46   Final   Colony Count NO GROWTH   Final   Culture NO GROWTH   Final   Report Status 01/06/2013 FINAL   Final  SURGICAL PCR SCREEN     Status: None   Collection Time    01/05/13  5:46 AM      Result Value Range Status   MRSA, PCR NEGATIVE  NEGATIVE Final   Staphylococcus aureus NEGATIVE  NEGATIVE Final   Comment:            The Xpert SA Assay (FDA     approved for NASAL specimens     in patients over 35 years of age),     is one component of     a  comprehensive surveillance     program.  Test performance has     been validated by [facility] for patients greater     than or equal to 61 year old.     It is not intended     to diagnose infection nor to     guide or monitor treatment.     Studies: Dg Hip Complete Right  01/04/2013   *RADIOLOGY REPORT*  Clinical Data: Fall, right hip pain  RIGHT HIP - COMPLETE 2+ VIEW  Comparison: None.  Findings: Hips are located.  There is a subtle cortical step off in the sub capital right femoral neck.  There this could represent a ring of osteophytosis but cannot exclude a sub capital nondisplaced fracture.  No evidence of pelvic fracture or sacral fracture  IMPRESSION: Cannot exclude a sub capital right femoral neck fracture.  Consider MRI or CT if continued clinical concern.   Original Report Authenticated By: Genevive Bi, M.D.   Dg Hip Operative Right  01/05/2013   *RADIOLOGY REPORT*  Clinical Data: Femur fracture  DG OPERATIVE RIGHT HIP   Comparison: 01/04/2013  Findings: Screws are now present transfixing the femoral neck fracture.  No breakage or loosening of the hardware.  Anatomic alignment of the osseous and prosthetic structures.  IMPRESSION: ORIF right femoral neck fracture.   Original Report Authenticated By: Jolaine Click, M.D.   Ct Head Wo Contrast  01/04/2013   *RADIOLOGY REPORT*  Clinical Data:  Fall and injury to the right hip.  CT HEAD WITHOUT CONTRAST CT CERVICAL SPINE WITHOUT CONTRAST  Technique:  Multidetector CT imaging of the head and cervical spine was performed following the standard protocol without intravenous contrast.  Multiplanar CT image reconstructions of the cervical spine were also generated.  Comparison:  09/13/2012  CT HEAD  Findings: No evidence for acute hemorrhage, mass lesion, midline shift, hydrocephalus or large infarct.  There is mild cerebral atrophy.  Low density in the periventricular and subcortical white matter suggests chronic changes.  There may be a small lacune in the left internal capsule genu.  No acute bony abnormality.  IMPRESSION: No acute intracranial abnormality.  Atrophy and evidence of chronic small vessel ischemic changes.  CT CERVICAL SPINE  Findings: Small amount of fluid in the left mastoid air cells. Degenerative changes at C1 and C2.  Multilevel degenerative facet disease.  There is a left laminectomy defect at C6.  This appears chronic and could represent postsurgical change or a developmental finding.  Lung apices are clear.  Alignment of the cervical spine is within normal limits.  Normal alignment at the cervicothoracic junction.  There is no significant soft tissue swelling within the neck.  IMPRESSION: No acute bony abnormality in the cervical spine.  Multilevel cervical spondylosis.   Original Report Authenticated By: Richarda Overlie, M.D.   Ct Cervical Spine Wo Contrast  01/04/2013   *RADIOLOGY REPORT*  Clinical Data:  Fall and injury to the right hip.  CT HEAD WITHOUT CONTRAST CT  CERVICAL SPINE WITHOUT CONTRAST  Technique:  Multidetector CT imaging of the head and cervical spine was performed following the standard protocol without intravenous contrast.  Multiplanar CT image reconstructions of the cervical spine were also generated.  Comparison:  09/13/2012  CT HEAD  Findings: No evidence for acute hemorrhage, mass lesion, midline shift, hydrocephalus or large infarct.  There is mild cerebral atrophy.  Low density in the periventricular and subcortical white matter suggests chronic changes.  There may be a  small lacune in the left internal capsule genu.  No acute bony abnormality.  IMPRESSION: No acute intracranial abnormality.  Atrophy and evidence of chronic small vessel ischemic changes.  CT CERVICAL SPINE  Findings: Small amount of fluid in the left mastoid air cells. Degenerative changes at C1 and C2.  Multilevel degenerative facet disease.  There is a left laminectomy defect at C6.  This appears chronic and could represent postsurgical change or a developmental finding.  Lung apices are clear.  Alignment of the cervical spine is within normal limits.  Normal alignment at the cervicothoracic junction.  There is no significant soft tissue swelling within the neck.  IMPRESSION: No acute bony abnormality in the cervical spine.  Multilevel cervical spondylosis.   Original Report Authenticated By: Richarda Overlie, M.D.   Dg Pelvis Portable  01/05/2013   *RADIOLOGY REPORT*  Clinical Data: Status post ORIF of the right hip.  PORTABLE PELVIS  Comparison: 01/04/2013.  Findings: There has been interval placement of three cannulated fixation screws in the right femoral neck traversing the subtle nondisplaced subcapital femoral neck fracture.  Anatomic alignment appears preserved.  The right femoral head appears to project within the right acetabulum on this single-view examination. Surgical clips are seen lateral to the right proximal femur, there is a small amount of gas in the overlying soft  tissues related to recent surgery.  IMPRESSION: 1.  Postoperative changes of ORIF, as above, with preservation of anatomic alignment in the right femoral neck.   Original Report Authenticated By: Trudie Reed, M.D.   Ct Hip Right Wo Contrast  01/04/2013   *RADIOLOGY REPORT*  Clinical Data: Fall, limping  CT OF THE RIGHT HIP WITHOUT CONTRAST  Technique:  Multidetector CT imaging was performed according to the standard protocol. Multiplanar CT image reconstructions were also generated.  Comparison: Plain films of 01/04/2013  Findings: There is a small cortical step off within the subcapital right femoral neck seen on the coronal projection (image 27, series 104).  This is felt most likely to represent osteophytosis.  There is no clear evidence of fracture.  No evidence of hematoma in the joint.  No evidence of fracture in the right hemi pelvis.  IMPRESSION:  1.  No evidence displaced fracture.  Cannot completely exclude a nondisplaced occult fracture of which MRI is more sensitive.  If the patient continues to have hip pain  recommend repeating radiographs.   Original Report Authenticated By: Genevive Bi, M.D.   Mr Hip Right Wo Contrast  01/05/2013   *RADIOLOGY REPORT*  Clinical Data: Right hip pain.  MRI OF THE RIGHT HIP WITHOUT CONTRAST  Technique:  Multiplanar, multisequence MR imaging was performed. No intravenous contrast was administered.  Comparison: CT scan 01/04/2013.  Findings: There is a nondisplaced subcapital fracture of the right hip.  There is an associated hip joint effusion.  Mild hip joint degenerative changes bilaterally.  The pubic symphysis and SI joints are intact.  No pelvic fractures.  No significant intrapelvic abnormalities are demonstrated.  No inguinal mass or hernia.  The surrounding hip and pelvic musculature appear normal except for mild muscle strain involving the obturator externus.  IMPRESSION: Nondisplaced subcapital fracture of the right hip.   Original Report  Authenticated By: Rudie Meyer, M.D.   Dg Chest Portable 1 View  01/05/2013   *RADIOLOGY REPORT*  Clinical Data: Preoperative evaluation for hip surgery  PORTABLE CHEST - 1 VIEW  Comparison: None.  Findings: The heart and pulmonary vascularity are within normal limits.  The lungs are  clear bilaterally.  No acute bony abnormality is noted.  IMPRESSION: No acute abnormalities seen.   Original Report Authenticated By: Alcide Clever, M.D.    Scheduled Meds: . acetaminophen  1,000 mg Intravenous Q6H  . aspirin EC  325 mg Oral Q breakfast  . carbamazepine  100 mg Oral Daily  . donepezil  10 mg Oral q morning - 10a  . memantine  10 mg Oral BID   Continuous Infusions: . lactated ringers 85 mL/hr at 01/06/13 1610    Principal Problem:   Closed right hip fracture Active Problems:   Tic douloureux   Alzheimer's disease   Hypertension   Hip fracture requiring operative repair    Time spent: 35 minutes.    Jodi Beltran  [facility] Pager [phone]  If 7PM-7AM, please contact night-coverage at [email or web address], password [facility] 01/06/2013, 3:36 PM  LOS: 2 days

## 2013-01-06 NOTE — Clinical Social Work Psychosocial (Signed)
Clinical Social Work Department  BRIEF PSYCHOSOCIAL ASSESSMENT  Patient:Tychelle COURTNEI RUDDELL   Account Number: [id number]  Admit date: 01/04/13 Clinical Social Worker Sabino Niemann, MSW Date/Time: 01/06/2013 1:45 PM Referred by: Physician Date Referred: 01/06/2013 Referred for   SNF Placement   Other Referral:  Interview type: Patient was sleeping. Sw spoke with patient's daughter  Other interview type: PSYCHOSOCIAL DATA  Living Status: Family Admitted from facility:  Level of care:  Primary support name: Ogren,[name]- MPOA  Primary support relationship to patient: Son Degree of support available:  Strong and vested  CURRENT CONCERNS  Current Concerns   Post-Acute Placement   Other Concerns:  SOCIAL WORK ASSESSMENT / PLAN  Patient was sleeping. CSW met with pt's daughter re: PT recommendation for SNF.   Pt lives with her son  CSW explained placement process and answered questions.   Pt's daughter would like SW to speak with patient's son but reported that she is agreeable to the patient being faxed out to SNF     CSW completed FL2 and initiated SNF search.     Assessment/plan status: Information/Referral to [facility]  Other assessment/ plan:  Information/referral to community resources:  SNF   PTAR  PATIENT'S/FAMILY'S RESPONSE TO PLAN OF CARE:  Pt's daughter reports she would like SW to speak with patient's son before making any decisions.   Pt's daughter verbalized understanding of placement process and appreciation for CSW assist.   Sabino Niemann, MSW [phone]

## 2013-01-06 NOTE — Evaluation (Signed)
Occupational Therapy Evaluation Patient Details Name: Jodi Beltran MRN: [id number] DOB: Oct 15, 1920 Today's Date: 01/06/2013 Time: 8657-8469 OT Time Calculation (min): 34 min  OT Assessment / Plan / Recommendation Clinical Impression  Pt is an 79 yr old female admitted after fall which she got tangled up in a blanket.  X-rays revealed right hip fracture and pt subsequently underwent right hip pinning.  Currenlty she needs max assist for LB selfcare tasks and mod assist for functional transfers.  Feel she will benefit from acute care OT services to help increase overall independence.  Based on currentl level of assist feel she will also require SNF for follow-up therapy.      OT Assessment  Patient needs continued OT Services    Follow Up Recommendations  SNF    Barriers to Discharge Decreased caregiver support          Frequency  Min 2X/week    Precautions / Restrictions Precautions Precautions: Fall Precaution Comments: pt with recent fall  Restrictions Weight Bearing Restrictions: No RLE Weight Bearing: Weight bearing as tolerated   Pertinent Vitals/Pain Pt with 2/10 on the faces scale in the right hip region, pt unable to state number    ADL  Eating/Feeding: Simulated;Independent Where Assessed - Eating/Feeding: Chair Grooming: Simulated;Set up Where Assessed - Grooming: Supported sitting Upper Body Bathing: Simulated;Set up Where Assessed - Upper Body Bathing: Supported sitting Lower Body Bathing: Simulated;Maximal assistance Where Assessed - Lower Body Bathing: Supported sit to stand Upper Body Dressing: Simulated;Set up Where Assessed - Upper Body Dressing: Unsupported sitting Lower Body Dressing: Simulated;+1 Total assistance Where Assessed - Lower Body Dressing: Supported sit to stand Toilet Transfer: Simulated;Moderate assistance Toilet Transfer Method: Stand pivot Toilet Transfer Equipment: Bedside commode Toileting - Clothing Manipulation and Hygiene:  Simulated;Moderate assistance Where Assessed - Toileting Clothing Manipulation and Hygiene: Sit to stand from 3-in-1 or toilet Tub/Shower Transfer Method: Not assessed Equipment Used: Rolling walker;Gait belt Transfers/Ambulation Related to ADLs: Pt overall mod assist level to take 3-4 small steps to the bedside chair using the RW. ADL Comments: Pt with decreased ability to reacher her lower legs secondary to pain, will benefit from AE education to help increase independence with LB selfcare.  Pt with history of dementia as well and was getting 6 hours of assistance from hired caregiver prior to admission and family was provideing the rest.  They have agreed to pursue short term SNF placement for follow-up therapy and 24 hour supervision.      OT Diagnosis: Generalized weakness;Acute pain  OT Problem List: Decreased strength;Decreased activity tolerance;Impaired balance (sitting and/or standing);Decreased safety awareness;Decreased cognition;Pain OT Treatment Interventions: Self-care/ADL training;DME and/or AE instruction;Patient/family education   OT Goals Acute Rehab OT Goals OT Goal Formulation: With patient/family Time For Goal Achievement: 01/20/13 Potential to Achieve Goals: Good ADL Goals Pt Will Perform Grooming: with supervision;Supported;Standing at sink ADL Goal: Grooming - Progress: Goal set today Pt Will Perform Lower Body Bathing: with min assist;Supported;Sit to stand from bed;with adaptive equipment ADL Goal: Lower Body Bathing - Progress: Goal set today Pt Will Perform Lower Body Dressing: with min assist;Sit to stand from bed;Supported;with adaptive equipment ADL Goal: Lower Body Dressing - Progress: Goal set today Pt Will Transfer to Toilet: with supervision;3-in-1;Ambulation ADL Goal: Toilet Transfer - Progress: Goal set today Miscellaneous OT Goals Miscellaneous OT Goal #1: Pt will transfer from supine to sit EOB with min assist in preparation for selfcare tasks and  toileting.   OT Goal: Miscellaneous Goal #1 - Progress: Goal set today  Visit Information  Last OT Received On: 01/06/13 Assistance Needed: +2    Subjective Data  Subjective: Pt did not state during session. Patient Stated Goal: Pt did not state.   Prior Functioning     Home Living Lives With: Alone Available Help at Discharge: Other (Comment) (son lives next door to her) Type of Home: House Home Access: Stairs to enter Secretary/administrator of Steps: 2 Entrance Stairs-Rails: Right Home Layout: One level Bathroom Shower/Tub: Engineer, manufacturing systems: Standard Bathroom Accessibility: Yes Home Adaptive Equipment: Paediatric nurse with back;Straight cane Additional Comments: had a caregiver 6hrs a day Prior Function Level of Independence: Needs assistance Needs Assistance: Bathing;Light Housekeeping;Meal Prep;Dressing Bath: Supervision/set-up Dressing: Supervision/set-up Meal Prep: Total Light Housekeeping: Total Able to Take Stairs?: Yes Driving: No Communication Communication: HOH         Vision/Perception Vision - History Baseline Vision: Wears glasses all the time Patient Visual Report: No change from baseline Vision - Assessment Eye Alignment: Within Functional Limits Vision Assessment: Vision not tested Perception Perception: Within Functional Limits Praxis Praxis: Intact   Cognition  Cognition Arousal/Alertness: Awake/alert Behavior During Therapy: WFL for tasks assessed/performed Overall Cognitive Status: History of cognitive impairments - at baseline    Extremity/Trunk Assessment Right Upper Extremity Assessment RUE ROM/Strength/Tone: Within functional levels RUE Sensation: WFL - Light Touch RUE Coordination: WFL - gross/fine motor Left Upper Extremity Assessment LUE ROM/Strength/Tone: Within functional levels LUE Sensation: WFL - Light Touch LUE Coordination: WFL - gross/fine motor Right Lower Extremity Assessment RLE  ROM/Strength/Tone: Deficits;Unable to fully assess;Due to pain;Due to impaired cognition RLE ROM/Strength/Tone Deficits: knee grossly 3/5; unable to fully assess due to cognition and pain in R LE RLE Sensation: WFL - Light Touch Left Lower Extremity Assessment LLE ROM/Strength/Tone: WFL for tasks assessed LLE Sensation: WFL - Light Touch Trunk Assessment Trunk Assessment: Normal     Mobility Bed Mobility Bed Mobility: Supine to Sit;Sitting - Scoot to Edge of Bed Supine to Sit: 3: Mod assist;HOB elevated;With rails Sitting - Scoot to Edge of Bed: 3: Mod assist Details for Bed Mobility Assistance: Pt needed assistance to move the RLE off of the edge of the bed. Transfers Transfers: Sit to Stand;Stand to Sit Sit to Stand: 4: Min assist;From bed;From elevated surface;With upper extremity assist Stand to Sit: 4: Min assist;To chair/3-in-1;With armrests;With upper extremity assist Details for Transfer Assistance: (A) to maintain balance and control descent to chair with UEs; verbal cues for safety and hand placement on RW        Balance Balance Balance Assessed: Yes Static Standing Balance Static Standing - Balance Support: Right upper extremity supported;Left upper extremity supported Static Standing - Level of Assistance: 4: Min assist   End of Session OT - End of Session Equipment Utilized During Treatment: Gait belt Activity Tolerance: Patient limited by pain Patient left: in chair;with call bell/phone within reach;with family/visitor present Nurse Communication: Mobility status     Dany Walther OTR/L Pager number [phone] 01/06/2013, 3:50 PM

## 2013-01-06 NOTE — Evaluation (Signed)
Physical Therapy Evaluation Patient Details Name: Jodi Beltran MRN: [id number] DOB: 02-08-1921 Today's Date: 01/06/2013 Time: 6295-2841 PT Time Calculation (min): 34 min  PT Assessment / Plan / Recommendation Clinical Impression  Pt is a 79 y.o. female s/p fall resulting in cannulating hip pin and has dementia. Pt presents with mobility and balance deficits, decreased strength and increased pain in R hip. Pt to benefit from skilled PT to maximize functional mobility and increase independence with mobility and transfers. Recommend ST-SNF upon acute D/C for further rehab. Discussed with family at length the benefits of SNF for rehab vs HHPT and family was very appreciative and agreeable.      PT Assessment  Patient needs continued PT services    Follow Up Recommendations  SNF;Supervision/Assistance - 24 hour    Does the patient have the potential to tolerate intense rehabilitation      Barriers to Discharge Decreased caregiver support has caregiver only 6 hours M-F    Equipment Recommendations  None recommended by PT    Recommendations for Other Services     Frequency Min 3X/week    Precautions / Restrictions Precautions Precautions: Fall Precaution Comments: pt with recent fall  Restrictions Weight Bearing Restrictions: No RLE Weight Bearing: Weight bearing as tolerated   Pertinent Vitals/Pain Pt did not rate pain; would grimace with activity; RN states pt was given Asprin and could have more pain medicine PRN.      Mobility  Bed Mobility Bed Mobility: Supine to Sit;Sitting - Scoot to Edge of Bed Supine to Sit: 3: Mod assist;HOB elevated;With rails Sitting - Scoot to Edge of Bed: 3: Mod assist Details for Bed Mobility Assistance: (A) to initate and facilitate movement of R LE to EOB and to (A) trunk to upright sitting position at EOB; requires increased time and max cues for sequencing Transfers Transfers: Sit to Stand;Stand to Sit Sit to Stand: 4: Min assist;From  bed;From elevated surface;With upper extremity assist Stand to Sit: 4: Min assist;To chair/3-in-1;With armrests;With upper extremity assist Details for Transfer Assistance: (A) to maintain balance and control descent to chair with UEs; verbal cues for safety and hand placement on RW Ambulation/Gait Ambulation/Gait Assistance: 4: Min assist Ambulation Distance (Feet): 4 Feet Assistive device: Rolling walker Ambulation/Gait Assistance Details: pt required (A) for management of RW; mod cues for gt sequencing and (A) to maintain balance due to decreased ability to WB through R LE due to pain  Gait Pattern: Step-to pattern;Trunk flexed;Decreased stance time - right;Decreased step length - left Gait velocity: decreased due to pain  Stairs: No Wheelchair Mobility Wheelchair Mobility: No    Exercises General Exercises - Lower Extremity Ankle Circles/Pumps: AROM;Both;10 reps;Supine   PT Diagnosis: Difficulty walking;Acute pain  PT Problem List: Decreased strength;Decreased range of motion;Decreased activity tolerance;Decreased balance;Decreased mobility;Decreased knowledge of use of DME;Decreased safety awareness;Pain PT Treatment Interventions: DME instruction;Gait training;Stair training;Functional mobility training;Therapeutic activities;Therapeutic exercise;Balance training;Neuromuscular re-education;Patient/family education   PT Goals Acute Rehab PT Goals PT Goal Formulation: With patient Time For Goal Achievement: 01/13/13 Potential to Achieve Goals: Good Pt will go Supine/Side to Sit: with modified independence PT Goal: Supine/Side to Sit - Progress: Goal set today Pt will go Sit to Supine/Side: with modified independence PT Goal: Sit to Supine/Side - Progress: Goal set today Pt will go Sit to Stand: with supervision PT Goal: Sit to Stand - Progress: Goal set today Pt will go Stand to Sit: with supervision PT Goal: Stand to Sit - Progress: Goal set today Pt will Ambulate: 51 -  150  feet;with supervision;with rolling walker PT Goal: Ambulate - Progress: Goal set today  Visit Information  Last PT Received On: 01/06/13 Assistance Needed: +2 PT/OT Co-Evaluation/Treatment: Yes    Subjective Data  Subjective: Pt lying supine with family present to help with PLOF and social history; pt agreeable to therapy  Patient Stated Goal: to walk and go home   Prior Functioning  Home Living Lives With: Alone Available Help at Discharge: Other (Comment) (son lives next door to her) Type of Home: House Home Access: Stairs to enter Secretary/administrator of Steps: 2 Entrance Stairs-Rails: Right Home Layout: One level Bathroom Shower/Tub: Engineer, manufacturing systems: Standard Bathroom Accessibility: Yes Home Adaptive Equipment: Paediatric nurse with back;Straight cane Additional Comments: had a caregiver 6hrs a day Prior Function Level of Independence: Needs assistance Needs Assistance: Bathing;Light Housekeeping;Meal Prep;Dressing Bath: Supervision/set-up Dressing: Supervision/set-up Meal Prep: Total Light Housekeeping: Total Able to Take Stairs?: Yes Driving: No Communication Communication: HOH    Cognition  Cognition Arousal/Alertness: Awake/alert Behavior During Therapy: WFL for tasks assessed/performed Overall Cognitive Status: History of cognitive impairments - at baseline    Extremity/Trunk Assessment Right Lower Extremity Assessment RLE ROM/Strength/Tone: Deficits;Unable to fully assess;Due to pain;Due to impaired cognition RLE ROM/Strength/Tone Deficits: knee grossly 3/5; unable to fully assess due to cognition and pain in R LE RLE Sensation: WFL - Light Touch Left Lower Extremity Assessment LLE ROM/Strength/Tone: WFL for tasks assessed LLE Sensation: WFL - Light Touch Trunk Assessment Trunk Assessment: Normal   Balance Balance Balance Assessed: Yes Static Standing Balance Static Standing - Balance Support: Bilateral upper extremity supported;During  functional activity Static Standing - Level of Assistance: 4: Min assist  End of Session PT - End of Session Equipment Utilized During Treatment: Gait belt Activity Tolerance: Patient tolerated treatment well Patient left: in chair;with call bell/phone within reach;with family/visitor present Nurse Communication: Mobility status  GP     Donell Sievert, [place] [phone] 01/06/2013, 3:39 PM

## 2013-01-06 NOTE — Progress Notes (Signed)
    Subjective: 1 Day Post-Op Procedure(s) (LRB): CANNULATED HIP PINNING RIGHT HIP (Right) Patient reports pain as 2 on 0-10 scale.   Denies CP or SOB.  Voiding without difficulty. Positive flatus. Objective: Vital signs in last 24 hours: Temp:  [96.8 F (36 C)-99 F (37.2 C)] 98.8 F (37.1 C) (06/19 0548) Pulse Rate:  [71-115] 95 (06/19 0548) Resp:  [11-20] 16 (06/19 0548) BP: (148-204)/(43-87) 157/54 mmHg (06/19 0548) SpO2:  [86 %-100 %] 98 % (06/19 0548)  Intake/Output from previous day: 06/18 0701 - 06/19 0700 In: 675 [I.V.:675] Out: 1525 [Urine:1525] Intake/Output this shift:    Labs:  Recent Labs  01/04/13 1731 01/05/13 0520 01/06/13 0430  HGB 11.6* 10.9* 10.5*    Recent Labs  01/05/13 0520 01/06/13 0430  WBC 8.9 9.7  RBC 3.54* 3.35*  HCT 33.0* 31.2*  PLT 218 184    Recent Labs  01/05/13 0520 01/06/13 0430  NA 140 137  K 3.9 3.8  CL 106 101  CO2 27 23  BUN 28* 18  CREATININE 0.88 0.70  GLUCOSE 110* 116*  CALCIUM 8.7 8.5   No results found for this basename: LABPT, INR,  in the last 72 hours  Physical Exam: Neurologically intact ABD soft Neurovascular intact Incision: dressing C/D/I and no drainage Compartment soft  Assessment/Plan: 1 Day Post-Op Procedure(s) (LRB): CANNULATED HIP PINNING RIGHT HIP (Right) Advance diet Up with therapy D/c plans per medicine    Jodi Beltran D for Dr. Venita Lick [facility] [phone] 01/06/2013, 10:43 AM

## 2013-01-07 LAB — CBC
HCT: 28.4 % — ABNORMAL LOW (ref 36.0–46.0)
MCH: 31.1 pg (ref 26.0–34.0)
MCHC: 33.8 g/dL (ref 30.0–36.0)
MCV: 91.9 fL (ref 78.0–100.0)
Platelets: 200 10*3/uL (ref 150–400)
RDW: 13.8 % (ref 11.5–15.5)
WBC: 8 10*3/uL (ref 4.0–10.5)

## 2013-01-07 LAB — BASIC METABOLIC PANEL
BUN: 16 mg/dL (ref 6–23)
Calcium: 8.8 mg/dL (ref 8.4–10.5)
Creatinine, Ser: 0.76 mg/dL (ref 0.50–1.10)
GFR calc Af Amer: 84 mL/min — ABNORMAL LOW (ref >90)
GFR calc non Af Amer: 73 mL/min — ABNORMAL LOW (ref >90)

## 2013-01-07 MED ORDER — ASPIRIN EC 325 MG PO TBEC: 325.0000 mg | DELAYED_RELEASE_TABLET | Freq: Every day | ORAL | Status: AC

## 2013-01-07 MED ORDER — ALUM & MAG HYDROXIDE-SIMETH 200-200-20 MG/5ML PO SUSP: 30.0000 mL | Freq: Four times a day (QID) | ORAL | Status: DC | PRN

## 2013-01-07 MED ORDER — TRAMADOL HCL 50 MG PO TABS: 50.0000 mg | ORAL_TABLET | Freq: Three times a day (TID) | ORAL | Status: DC | PRN

## 2013-01-07 MED ORDER — TRAMADOL-ACETAMINOPHEN 37.5-325 MG PO TABS: 1.0000 | ORAL_TABLET | Freq: Three times a day (TID) | ORAL | Status: DC | PRN

## 2013-01-07 NOTE — Discharge Planning (Signed)
Report called to Norm Parcel at [facility].

## 2013-01-07 NOTE — Progress Notes (Signed)
Clinical social worker assisted with patient discharge to skilled nursing facility, [facility].  CSW addressed all family questions and concerns. CSW copied chart and added all important documents. CSW also set up patient transportation with [facility]. Clinical Social Worker will sign off for now as social work intervention is no longer needed.  Noah Pelaez, MSW, [phone] 

## 2013-01-07 NOTE — Progress Notes (Signed)
    Subjective: 2 Days Post-Op Procedure(s) (LRB): CANNULATED HIP PINNING RIGHT HIP (Right) Patient reports pain as 3 on 0-10 scale.   Denies CP or SOB.  Voiding without difficulty. Positive flatus. Objective: Vital signs in last 24 hours: Temp:  [98.6 F (37 C)-99.1 F (37.3 C)] 98.6 F (37 C) (06/20 0535) Pulse Rate:  [79-91] 91 (06/20 0535) Resp:  [16-18] 18 (06/20 0535) BP: (149-178)/(50-52) 149/50 mmHg (06/20 0535) SpO2:  [94 %-96 %] 96 % (06/20 0535)  Intake/Output from previous day:   Intake/Output this shift:    Labs:  Recent Labs  01/04/13 1731 01/05/13 0520 01/06/13 0430 01/07/13 0440  HGB 11.6* 10.9* 10.5* 9.6*    Recent Labs  01/06/13 0430 01/07/13 0440  WBC 9.7 8.0  RBC 3.35* 3.09*  HCT 31.2* 28.4*  PLT 184 200    Recent Labs  01/06/13 0430 01/07/13 0440  NA 137 137  K 3.8 3.4*  CL 101 102  CO2 23 24  BUN 18 16  CREATININE 0.70 0.76  GLUCOSE 116* 105*  CALCIUM 8.5 8.8   No results found for this basename: LABPT, INR,  in the last 72 hours  Physical Exam: Neurologically intact ABD soft Intact pulses distally Incision: dressing C/D/I and no drainage Compartment soft  Assessment/Plan: 2 Days Post-Op Procedure(s) (LRB): CANNULATED HIP PINNING RIGHT HIP (Right) Advance diet Up with therapy Discharge to SNF xrays satisfactory post ORIF Stable from Ortho standpoint. Ok for d/c once dispo is determined Weight bear as tolerated Tramadol for pain to minimize effects of narcotics ASA for DVT prevention  Lyliana Dicenso D for Dr. Venita Lick [facility] [phone] 01/07/2013, 7:32 AM

## 2013-01-07 NOTE — Discharge Summary (Signed)
Physician Discharge Summary  Jodi Beltran JYN:[id number] DOB: 1921-04-01 DOA: 01/04/2013  PCP: Garlan Fillers, MD  Admit date: 01/04/2013 Discharge date: 01/07/2013  Time spent: Greater than 30 minutes  Recommendations for Outpatient Follow-up:  -Will be going to SNF for rehab following hip fracture.   Discharge Diagnoses:  Principal Problem:   Closed right hip fracture Active Problems:   Tic douloureux   Alzheimer's disease   Hypertension   Hip fracture requiring operative repair   Discharge Condition: Stable and improved  There were no vitals filed for this visit.  History of present illness:  Patient is an 79 y/o woman who presented with : Patient tripped over a blanket and fell while care giver has stepped out. When her family came to check on her she was limping. She started to have sever hip pain and was brought to ER. Initial plain films and CT scan was unremarkable MRI did show a fracture. Orthopedics have been consulted with plan to operate in AM. Hospitalis called for an admission. She had no CAD, has no exertional symptoms with ambulation.    Hospital Course:   Closed Right Hip Fracture  -s/p repair by Dr. Shon Baton.  -To SNF for ST-rehab. -To be weight-bearing as tolerated.  Dementia  -At baseline. -Pleasant; without behavioral disturbances this hospitalization.   Procedures:  Closed reduction, internal fixation with cannulated screws on the right hip on 01/05/13.  Consultations:  Ortho, Dr. Shon Baton  Discharge Instructions  Discharge Orders   Future Orders Complete By Expires     Discontinue IV  As directed     Increase activity slowly  As directed         Medication List    TAKE these medications       aspirin EC 325 MG tablet  Take 1 tablet (325 mg total) by mouth daily.     carbamazepine 100 MG chewable tablet  Commonly known as:  TEGRETOL  Chew 100 mg by mouth daily. For pain     donepezil 10 MG tablet  Commonly known as:  ARICEPT   Take 1 tablet (10 mg total) by mouth every morning.     memantine 10 MG tablet  Commonly known as:  NAMENDA  Take 1 tablet (10 mg total) by mouth 2 (two) times daily.     traMADol 50 MG tablet  Commonly known as:  ULTRAM  Take 1 tablet (50 mg total) by mouth every 8 (eight) hours as needed.     traMADol-acetaminophen 37.5-325 MG per tablet  Commonly known as:  ULTRACET  Take 1 tablet by mouth every 8 (eight) hours as needed for pain.     Vitamin D 2000 UNITS Caps  Take 1 capsule by mouth daily.       Allergies  Allergen Reactions  . Other Other (See Comments)    Maple- reaction unknown       Follow-up Information   Follow up with Alvy Beal, MD. Schedule an appointment as soon as possible for a visit in 2 weeks.   Contact information:   [address], [address] [place] [address] [phone]        The results of significant diagnostics from this hospitalization (including imaging, microbiology, ancillary and laboratory) are listed below for reference.    Significant Diagnostic Studies: Dg Hip Complete Right  01/04/2013   *RADIOLOGY REPORT*  Clinical Data: Fall, right hip pain  RIGHT HIP - COMPLETE 2+ VIEW  Comparison: None.  Findings: Hips  are located.  There is a subtle cortical step off in the sub capital right femoral neck.  There this could represent a ring of osteophytosis but cannot exclude a sub capital nondisplaced fracture.  No evidence of pelvic fracture or sacral fracture  IMPRESSION: Cannot exclude a sub capital right femoral neck fracture.  Consider MRI or CT if continued clinical concern.   Original Report Authenticated By: Genevive Bi, M.D.   Dg Hip Operative Right  01/05/2013   *RADIOLOGY REPORT*  Clinical Data: Femur fracture  DG OPERATIVE RIGHT HIP  Comparison: 01/04/2013  Findings: Screws are now present transfixing the femoral neck fracture.  No breakage or loosening of the hardware.  Anatomic alignment of  the osseous and prosthetic structures.  IMPRESSION: ORIF right femoral neck fracture.   Original Report Authenticated By: Jolaine Click, M.D.   Ct Head Wo Contrast  01/04/2013   *RADIOLOGY REPORT*  Clinical Data:  Fall and injury to the right hip.  CT HEAD WITHOUT CONTRAST CT CERVICAL SPINE WITHOUT CONTRAST  Technique:  Multidetector CT imaging of the head and cervical spine was performed following the standard protocol without intravenous contrast.  Multiplanar CT image reconstructions of the cervical spine were also generated.  Comparison:  09/13/2012  CT HEAD  Findings: No evidence for acute hemorrhage, mass lesion, midline shift, hydrocephalus or large infarct.  There is mild cerebral atrophy.  Low density in the periventricular and subcortical white matter suggests chronic changes.  There may be a small lacune in the left internal capsule genu.  No acute bony abnormality.  IMPRESSION: No acute intracranial abnormality.  Atrophy and evidence of chronic small vessel ischemic changes.  CT CERVICAL SPINE  Findings: Small amount of fluid in the left mastoid air cells. Degenerative changes at C1 and C2.  Multilevel degenerative facet disease.  There is a left laminectomy defect at C6.  This appears chronic and could represent postsurgical change or a developmental finding.  Lung apices are clear.  Alignment of the cervical spine is within normal limits.  Normal alignment at the cervicothoracic junction.  There is no significant soft tissue swelling within the neck.  IMPRESSION: No acute bony abnormality in the cervical spine.  Multilevel cervical spondylosis.   Original Report Authenticated By: Richarda Overlie, M.D.   Ct Cervical Spine Wo Contrast  01/04/2013   *RADIOLOGY REPORT*  Clinical Data:  Fall and injury to the right hip.  CT HEAD WITHOUT CONTRAST CT CERVICAL SPINE WITHOUT CONTRAST  Technique:  Multidetector CT imaging of the head and cervical spine was performed following the standard protocol without  intravenous contrast.  Multiplanar CT image reconstructions of the cervical spine were also generated.  Comparison:  09/13/2012  CT HEAD  Findings: No evidence for acute hemorrhage, mass lesion, midline shift, hydrocephalus or large infarct.  There is mild cerebral atrophy.  Low density in the periventricular and subcortical white matter suggests chronic changes.  There may be a small lacune in the left internal capsule genu.  No acute bony abnormality.  IMPRESSION: No acute intracranial abnormality.  Atrophy and evidence of chronic small vessel ischemic changes.  CT CERVICAL SPINE  Findings: Small amount of fluid in the left mastoid air cells. Degenerative changes at C1 and C2.  Multilevel degenerative facet disease.  There is a left laminectomy defect at C6.  This appears chronic and could represent postsurgical change or a developmental finding.  Lung apices are clear.  Alignment of the cervical spine is within normal limits.  Normal alignment at the  cervicothoracic junction.  There is no significant soft tissue swelling within the neck.  IMPRESSION: No acute bony abnormality in the cervical spine.  Multilevel cervical spondylosis.   Original Report Authenticated By: Richarda Overlie, M.D.   Dg Pelvis Portable  01/05/2013   *RADIOLOGY REPORT*  Clinical Data: Status post ORIF of the right hip.  PORTABLE PELVIS  Comparison: 01/04/2013.  Findings: There has been interval placement of three cannulated fixation screws in the right femoral neck traversing the subtle nondisplaced subcapital femoral neck fracture.  Anatomic alignment appears preserved.  The right femoral head appears to project within the right acetabulum on this single-view examination. Surgical clips are seen lateral to the right proximal femur, there is a small amount of gas in the overlying soft tissues related to recent surgery.  IMPRESSION: 1.  Postoperative changes of ORIF, as above, with preservation of anatomic alignment in the right femoral neck.    Original Report Authenticated By: Trudie Reed, M.D.   Ct Hip Right Wo Contrast  01/04/2013   *RADIOLOGY REPORT*  Clinical Data: Fall, limping  CT OF THE RIGHT HIP WITHOUT CONTRAST  Technique:  Multidetector CT imaging was performed according to the standard protocol. Multiplanar CT image reconstructions were also generated.  Comparison: Plain films of 01/04/2013  Findings: There is a small cortical step off within the subcapital right femoral neck seen on the coronal projection (image 27, series 104).  This is felt most likely to represent osteophytosis.  There is no clear evidence of fracture.  No evidence of hematoma in the joint.  No evidence of fracture in the right hemi pelvis.  IMPRESSION:  1.  No evidence displaced fracture.  Cannot completely exclude a nondisplaced occult fracture of which MRI is more sensitive.  If the patient continues to have hip pain  recommend repeating radiographs.   Original Report Authenticated By: Genevive Bi, M.D.   Mr Hip Right Wo Contrast  01/05/2013   *RADIOLOGY REPORT*  Clinical Data: Right hip pain.  MRI OF THE RIGHT HIP WITHOUT CONTRAST  Technique:  Multiplanar, multisequence MR imaging was performed. No intravenous contrast was administered.  Comparison: CT scan 01/04/2013.  Findings: There is a nondisplaced subcapital fracture of the right hip.  There is an associated hip joint effusion.  Mild hip joint degenerative changes bilaterally.  The pubic symphysis and SI joints are intact.  No pelvic fractures.  No significant intrapelvic abnormalities are demonstrated.  No inguinal mass or hernia.  The surrounding hip and pelvic musculature appear normal except for mild muscle strain involving the obturator externus.  IMPRESSION: Nondisplaced subcapital fracture of the right hip.   Original Report Authenticated By: Rudie Meyer, M.D.   Dg Chest Portable 1 View  01/05/2013   *RADIOLOGY REPORT*  Clinical Data: Preoperative evaluation for hip surgery  PORTABLE CHEST  - 1 VIEW  Comparison: None.  Findings: The heart and pulmonary vascularity are within normal limits.  The lungs are clear bilaterally.  No acute bony abnormality is noted.  IMPRESSION: No acute abnormalities seen.   Original Report Authenticated By: Alcide Clever, M.D.    Microbiology: Recent Results (from the past 240 hour(s))  URINE CULTURE     Status: None   Collection Time    01/05/13  5:44 AM      Result Value Range Status   Specimen Description URINE, CATHETERIZED   Final   Special Requests NONE   Final   Culture  Setup Time 01/05/2013 11:46   Final   Colony Count NO GROWTH  Final   Culture NO GROWTH   Final   Report Status 01/06/2013 FINAL   Final  SURGICAL PCR SCREEN     Status: None   Collection Time    01/05/13  5:46 AM      Result Value Range Status   MRSA, PCR NEGATIVE  NEGATIVE Final   Staphylococcus aureus NEGATIVE  NEGATIVE Final   Comment:            The Xpert SA Assay (FDA     approved for NASAL specimens     in patients over 71 years of age),     is one component of     a comprehensive surveillance     program.  Test performance has     been validated by [employer] for patients greater     than or equal to 27 year old.     It is not intended     to diagnose infection nor to     guide or monitor treatment.     Labs: Basic Metabolic Panel:  Recent Labs Lab 01/04/13 1731 01/05/13 0520 01/06/13 0430 01/07/13 0440  NA 141 140 137 137  K 4.6 3.9 3.8 3.4*  CL 106 106 101 102  CO2 27 27 23 24   GLUCOSE 127* 110* 116* 105*  BUN 35* 28* 18 16  CREATININE 0.94 0.88 0.70 0.76  CALCIUM 9.2 8.7 8.5 8.8   Liver Function Tests: No results found for this basename: AST, ALT, ALKPHOS, BILITOT, PROT, ALBUMIN,  in the last 168 hours No results found for this basename: LIPASE, AMYLASE,  in the last 168 hours No results found for this basename: AMMONIA,  in the last 168 hours CBC:  Recent Labs Lab 01/04/13 1731 01/05/13 0520 01/06/13 0430 01/07/13 0440   WBC 12.9* 8.9 9.7 8.0  HGB 11.6* 10.9* 10.5* 9.6*  HCT 34.9* 33.0* 31.2* 28.4*  MCV 93.6 93.2 93.1 91.9  PLT 236 218 184 200   Cardiac Enzymes: No results found for this basename: CKTOTAL, CKMB, CKMBINDEX, TROPONINI,  in the last 168 hours BNP: BNP (last 3 results) No results found for this basename: PROBNP,  in the last 8760 hours CBG: No results found for this basename: GLUCAP,  in the last 168 hours     Signed:  Chaya Jan  [facility] Pager: [phone] 01/07/2013, 10:45 AM

## 2013-01-08 LAB — CARBAMAZEPINE, FREE AND TOTAL
Carbamazepine Metabolite -: 0.8 ug/mL (ref 0.2–2.0)
Carbamazepine Metabolite: 0.5 ug/mL (ref 0.1–1.0)
Carbamazepine, Total: 2.9 ug/mL — ABNORMAL LOW (ref 4.0–12.0)

## 2013-01-08 LAB — VITAMIN D 1,25 DIHYDROXY
Vitamin D2 1, 25 (OH)2: <8 pg/mL
Vitamin D3 1, 25 (OH)2: 31 pg/mL

## 2013-01-11 ENCOUNTER — Non-Acute Institutional Stay (SKILLED_NURSING_FACILITY): Payer: Medicare Other | Admitting: Adult Health

## 2013-01-11 DIAGNOSIS — K5900 Constipation, unspecified: Secondary | ICD-10-CM

## 2013-01-11 DIAGNOSIS — F0280 Dementia in other diseases classified elsewhere without behavioral disturbance: Secondary | ICD-10-CM

## 2013-01-11 DIAGNOSIS — G500 Trigeminal neuralgia: Secondary | ICD-10-CM

## 2013-01-11 DIAGNOSIS — G309 Alzheimer's disease, unspecified: Secondary | ICD-10-CM

## 2013-01-11 DIAGNOSIS — S72001D Fracture of unspecified part of neck of right femur, subsequent encounter for closed fracture with routine healing: Secondary | ICD-10-CM

## 2013-01-11 DIAGNOSIS — I10 Essential (primary) hypertension: Secondary | ICD-10-CM

## 2013-01-11 DIAGNOSIS — S72009D Fracture of unspecified part of neck of unspecified femur, subsequent encounter for closed fracture with routine healing: Secondary | ICD-10-CM

## 2013-01-11 DIAGNOSIS — D62 Acute posthemorrhagic anemia: Secondary | ICD-10-CM

## 2013-01-12 ENCOUNTER — Telehealth: Payer: Self-pay | Admitting: Neurology

## 2013-01-12 ENCOUNTER — Encounter (HOSPITAL_COMMUNITY): Payer: Self-pay | Admitting: Orthopedic Surgery

## 2013-01-13 ENCOUNTER — Non-Acute Institutional Stay (SKILLED_NURSING_FACILITY): Payer: Medicare Other | Admitting: Internal Medicine

## 2013-01-13 DIAGNOSIS — G309 Alzheimer's disease, unspecified: Secondary | ICD-10-CM

## 2013-01-13 DIAGNOSIS — S72009S Fracture of unspecified part of neck of unspecified femur, sequela: Secondary | ICD-10-CM

## 2013-01-13 DIAGNOSIS — D62 Acute posthemorrhagic anemia: Secondary | ICD-10-CM

## 2013-01-13 DIAGNOSIS — F0280 Dementia in other diseases classified elsewhere without behavioral disturbance: Secondary | ICD-10-CM

## 2013-01-13 DIAGNOSIS — I10 Essential (primary) hypertension: Secondary | ICD-10-CM

## 2013-01-13 DIAGNOSIS — S72001S Fracture of unspecified part of neck of right femur, sequela: Secondary | ICD-10-CM

## 2013-01-23 ENCOUNTER — Encounter: Payer: Self-pay | Admitting: Adult Health

## 2013-01-23 DIAGNOSIS — K5900 Constipation, unspecified: Secondary | ICD-10-CM | POA: Insufficient documentation

## 2013-01-23 DIAGNOSIS — D62 Acute posthemorrhagic anemia: Secondary | ICD-10-CM | POA: Insufficient documentation

## 2013-01-23 NOTE — Progress Notes (Signed)
  Subjective:    Patient ID: Jodi Beltran, female    DOB: 03-17-1921, 79 y.o.   MRN: [id number]  HPI This is an 79 year old female admitted to [facility] on 01/07/13 ROM [facility] with closed right hip fracture status post repair. She has been admitted for short-term rehabilitation.    Review of Systems  Constitutional: Negative.   HENT: Negative.   Eyes: Negative.   Respiratory: Negative for shortness of breath.   Cardiovascular: Negative for leg swelling.  Gastrointestinal: Positive for constipation.  Endocrine: Negative.   Genitourinary: Negative.   Neurological: Negative.   Hematological: Negative for adenopathy. Does not bruise/bleed easily.  Psychiatric/Behavioral: Negative.        Objective:   Physical Exam  Constitutional: She is oriented to person, place, and time. She appears well-developed and well-nourished.  HENT:  Head: Normocephalic and atraumatic.  Right Ear: External ear normal.  Left Ear: External ear normal.  Nose: Nose normal.  Mouth/Throat: Oropharynx is clear and moist.  Eyes: Conjunctivae are normal. Pupils are equal, round, and reactive to light.  Neck: Normal range of motion. Neck supple.  Cardiovascular: Normal rate, regular rhythm, normal heart sounds and intact distal pulses.   Pulmonary/Chest: Effort normal and breath sounds normal.  Abdominal: Soft. Bowel sounds are normal. She exhibits no distension. There is no tenderness.  Musculoskeletal: She exhibits no edema and no tenderness.  Neurological: She is alert and oriented to person, place, and time.  Skin: Skin is warm and dry.  Psychiatric: She has a normal mood and affect. Her behavior is normal. Judgment and thought content normal.    LABS: 01/07/13 sodium 137 potassium 3.4 glucose 105 BUN 16 creatinine 0.7-6 calcium 8.8 WBC 8.0 hemoglobin 9.6 hematocrit 28.4    Medications reviewed.    Assessment & Plan:   Acute blood loss anemia - start FeSO4 325 mg 1 tab PO Q D and  repeat CBC in 1 week  Constipation - start Senna-S 2 tabs PO Q AM and Mitalax 17 gm + 6-8 oz liquid PO Q D  Closed right hip fracture S/P Repair - for OT/PT  Hypertension - not on any medications; well-controlled.  Alzheimer's Disease - stable  Tic Douloureux - stable

## 2013-02-04 ENCOUNTER — Non-Acute Institutional Stay (SKILLED_NURSING_FACILITY): Payer: Medicare Other | Admitting: Adult Health

## 2013-02-04 DIAGNOSIS — G500 Trigeminal neuralgia: Secondary | ICD-10-CM

## 2013-02-04 DIAGNOSIS — S72001R Fracture of unspecified part of neck of right femur, subsequent encounter for open fracture type IIIA, IIIB, or IIIC with malunion: Secondary | ICD-10-CM

## 2013-02-04 DIAGNOSIS — F0280 Dementia in other diseases classified elsewhere without behavioral disturbance: Secondary | ICD-10-CM

## 2013-02-04 DIAGNOSIS — D62 Acute posthemorrhagic anemia: Secondary | ICD-10-CM

## 2013-02-04 DIAGNOSIS — IMO0002 Reserved for concepts with insufficient information to code with codable children: Secondary | ICD-10-CM

## 2013-02-04 DIAGNOSIS — K5900 Constipation, unspecified: Secondary | ICD-10-CM

## 2013-02-07 ENCOUNTER — Non-Acute Institutional Stay (SKILLED_NURSING_FACILITY): Payer: Medicare Other | Admitting: Internal Medicine

## 2013-02-07 DIAGNOSIS — S72001S Fracture of unspecified part of neck of right femur, sequela: Secondary | ICD-10-CM

## 2013-02-07 DIAGNOSIS — G500 Trigeminal neuralgia: Secondary | ICD-10-CM

## 2013-02-07 DIAGNOSIS — F0280 Dementia in other diseases classified elsewhere without behavioral disturbance: Secondary | ICD-10-CM

## 2013-02-07 DIAGNOSIS — I10 Essential (primary) hypertension: Secondary | ICD-10-CM

## 2013-02-16 NOTE — Progress Notes (Signed)
Patient ID: Jodi Beltran, female   DOB: [birth year], 79 y.o.   MRN: [id number]        HISTORY & PHYSICAL  DATE: 01/13/2013   FACILITY: [facility]  LEVEL OF CARE: SNF (31)  ALLERGIES:  Allergies  Allergen Reactions  . Other Other (See Comments)    Maple- reaction unknown    CHIEF COMPLAINT:  Manage right hip fracture, Alzheimer's dementia, and hypertension.    HISTORY OF PRESENT ILLNESS:  The patient is an 79 year-old, Caucasian female.    HIP FRACTURE: The patient had a mechanical fall and sustained a femur fracture.  Patient subsequently underwent surgical repair and tolerated the procedure well. Patient is admitted to this facility for short-term rehabilitation. Patient denies hip pain currently. No complications reported from the pain medications currently being used.    DEMENTIA: The dementia remains stable and continues to function adequately in the current living environment with supervision.  The patient has had little changes in behavior. No complications noted from the medications presently being used.   HTN: Pt 's HTN remains stable.  Denies CP, sob, DOE, pedal edema, headaches, dizziness or visual disturbances.  No complications from the medications currently being used.  Last BP :  An admission blood pressure not recorded.    PAST MEDICAL HISTORY :  Past Medical History  Diagnosis Date  . Dementia   . Tic douloureux   . Arthritis   . Tic douloureux   . Alzheimer disease     PAST SURGICAL HISTORY: Past Surgical History  Procedure Laterality Date  . Tumors removal benign    . Back surgery    . Bladder tact    . Abdominal hysterectomy    . Hip pinning,cannulated Right 01/05/2013    Procedure: CANNULATED HIP PINNING RIGHT HIP;  Surgeon: Venita Lick, MD;  Location: [facility] OR;  Service: Orthopedics;  Laterality: Right;    SOCIAL HISTORY:  reports that she has never smoked. She has never used smokeless tobacco. She reports that she does not drink  alcohol or use illicit drugs.  FAMILY HISTORY:  Family History  Problem Relation Age of Onset  . Lung cancer Mother     CURRENT MEDICATIONS: Reviewed per [facility]  REVIEW OF SYSTEMS:  See HPI otherwise 14 point ROS is negative.  PHYSICAL EXAMINATION  VS:  not recorded   GENERAL: no acute distress, thin body habitus EYES: conjunctivae normal, sclerae normal, normal eye lids MOUTH/THROAT: lips without lesions,no lesions in the mouth,tongue is without lesions,uvula elevates in midline NECK: supple, trachea midline, no neck masses, no thyroid tenderness, no thyromegaly LYMPHATICS: no LAN in the neck, no supraclavicular LAN RESPIRATORY: breathing is even & unlabored, BS CTAB CARDIAC: RRR, no murmur,no extra heart sounds, no edema GI:  ABDOMEN: abdomen soft, normal BS, no masses, no tenderness  LIVER/SPLEEN: no hepatomegaly, no splenomegaly MUSCULOSKELETAL: HEAD: normal to inspection & palpation BACK: no kyphosis, scoliosis or spinal processes tenderness EXTREMITIES: LEFT UPPER EXTREMITY: full range of motion, normal strength & tone RIGHT UPPER EXTREMITY:  full range of motion, normal strength & tone LEFT LOWER EXTREMITY: strength intact, range of motion moderate  RIGHT LOWER EXTREMITY:  full range of motion, normal strength & tone PSYCHIATRIC: the patient is alert & oriented to person, affect & behavior appropriate  LABS/RADIOLOGY: 01/10/2013 in the facility:  Hemoglobin 9.1, MCV 89.6, otherwise CBC normal.     BUN 25, otherwise BMP normal.    Urine culture showed no growth.    MRSA by  PCR negative.    Staph aureus by PCR negative.    Potassium 3.4, glucose 105, otherwise CMP normal.    Hemoglobin 9.6, MCV 91.9, otherwise CBC normal.    MRI of the right hip showed nondisplaced subcapital fracture of the right hip.    Chest x-ray:  No acute disease.    CT of the right hip did not show any fracture.    Pelvic x-ray postsurgically showed postoperative changes of ORIF.     CT of the cervical spine:  Negative for acute findings.    CT of the head:  Negative for acute findings.    Right hip x-ray:  Unable to exclude a subcapital right femoral neck fracture.    Right hip x-ray postoperatively showed ORIF of right femoral neck fracture.     ASSESSMENT/PLAN:  Right femoral neck fracture.  Status post ORIF.  Continue rehabilitation.    Alzheimer's dementia.  Stable.  Hypertension.  Request blood pressure charting.  Continue current medications.    Acute blood loss anemia.  Hemoglobin declined.  Iron started.  Recheck pending.    I have reviewed patient's medical records received at admission/from hospitalization.  CPT CODE: 16109

## 2013-02-18 ENCOUNTER — Other Ambulatory Visit: Payer: Self-pay

## 2013-02-18 MED ORDER — CARBAMAZEPINE 100 MG PO CHEW: 100.0000 mg | CHEWABLE_TABLET | ORAL | Status: DC | PRN

## 2013-02-18 NOTE — Telephone Encounter (Signed)
[facility] patient assigned to Dr Yan  

## 2013-02-23 ENCOUNTER — Other Ambulatory Visit: Payer: Self-pay

## 2013-03-08 NOTE — Progress Notes (Signed)
Patient ID: Jodi Beltran, female   DOB: Nov 15, 1920, 79 y.o.   MRN: [id number]        PROGRESS NOTE  DATE: 02/07/2013   FACILITY: [facility]  LEVEL OF CARE: SNF (31)  Discharge Visit  CHIEF COMPLAINT:  Manage right hip fracture.    HISTORY OF PRESENT ILLNESS: I was requested by the social worker to perform face-to-face evaluation for discharge:  Patient was admitted to this facility for short-term rehabilitation after the patient's recent hospitalization.  Patient has completed SNF rehabilitation and therapy has cleared the patient for discharge.  Reassessment of ongoing problem(s):  HIP FRACTURE: The patient had a mechanical fall and sustained a femur fracture.  Patient subsequently underwent surgical repair and tolerated the procedure well. Patient is admitted to this facility for short-term rehabilitation. Patient denies hip pain currently. No complications reported from the pain medications currently being used.    PAST MEDICAL HISTORY : Reviewed.  No changes.  CURRENT MEDICATIONS: Reviewed per [facility]  REVIEW OF SYSTEMS:  GENERAL: no change in appetite, no fatigue, no weight changes, no fever, chills or weakness RESPIRATORY: no cough, SOB, DOE, wheezing, hemoptysis CARDIAC: no chest pain, edema or palpitations GI: no abdominal pain, diarrhea, constipation, heart burn, nausea or vomiting  PHYSICAL EXAMINATION  VS:  T 97.2      P 56      RR 18     BP 122/58     POX %       WT (Lb)  GENERAL: no acute distress, thin body habitus NECK: supple, trachea midline, no neck masses, no thyroid tenderness, no thyromegaly RESPIRATORY: breathing is even & unlabored, BS CTAB CARDIAC: RRR, no murmur,no extra heart sounds, no edema GI: abdomen soft, normal BS, no masses, no tenderness, no hepatomegaly, no splenomegaly PSYCHIATRIC: the patient is alert & oriented to person, affect & behavior appropriate  LABS/RADIOLOGY: 01/2013:  Hemoglobin 9.2, MCV 88.9, platelets 514,  WBC 6.6.    12/2012:  BUN 25, otherwise BMP normal.    ASSESSMENT/PLAN:  Right hip fracture.  Progressed well with therapy.    Alzheimer's dementia.  Stable.    Hypertension.  Well controlled.    Tic douloureux.  Continue Tegretol.    I have filled out patient's discharge paperwork and written prescriptions.    Total discharge time: Less than 30 minutes Discharge time involved coordination of the discharge process with Child psychotherapist, nursing staff and therapy department. Medical justification for home health services/DME verified.  CPT CODE: 78295

## 2013-03-19 ENCOUNTER — Encounter: Payer: Self-pay | Admitting: Adult Health

## 2013-03-19 NOTE — Progress Notes (Signed)
Patient ID: Jodi Beltran, female   DOB: 1921/04/19, 79 y.o.   MRN: [id number]       PROGRESS NOTE  DATE: 02/04/2013  FACILITY: Nursing Home Location: [facility]  LEVEL OF CARE: SNF (31)  Routine Visit  CHIEF COMPLAINT:  Manage trigeminal neuralgia, dementia, anemia and constipation  HISTORY OF PRESENT ILLNESS:  REASSESSMENT OF ONGOING PROBLEM(S):  DEMENTIA: The dementia remaines stable and continues to function adequately in the current living environment with supervision.  The patient has had little changes in behavior. No complications noted from the medications presently being used.  CONSTIPATION: The constipation remains stable. No complications from the medications presently being used. Patient denies ongoing constipation, abdominal pain, nausea or vomiting.Marland Kitchen PAST MEDICAL HISTORY : Reviewed.  No changes.  ANEMIA: The anemia has been stable. The patient denies fatigue, melena or hematochezia. No complications from the medications currently being used.   CURRENT MEDICATIONS: Reviewed per [facility]  REVIEW OF SYSTEMS:  GENERAL: no change in appetite, no fatigue, no weight changes, no fever, chills or weakness RESPIRATORY: no cough, SOB, DOE, wheezing, hemoptysis CARDIAC: no chest pain, edema or palpitations GI: no abdominal pain, diarrhea, constipation, heart burn, nausea or vomiting  PHYSICAL EXAMINATION  VS:  T 97.7       P 83      RR 18      BP 120/67     POX 98 %     WT 119 (Lb)  GENERAL: no acute distress, normal body habitus EYES: conjunctivae normal, sclerae normal, normal eye lids NECK: supple, trachea midline, no neck masses, no thyroid tenderness, no thyromegaly LYMPHATICS: no LAN in the neck, no supraclavicular LAN RESPIRATORY: breathing is even & unlabored, BS CTAB CARDIAC: RRR, no murmur,no extra heart sounds, no edema GI: abdomen soft, normal BS, no masses, no tenderness, no hepatomegaly, no splenomegaly PSYCHIATRIC: the patient is alert &  oriented to person, affect & behavior appropriate  LABS/RADIOLOGY: 7/14 WBC 6.6 hemoglobin 9.2 hematocrit 27.9 01/10/13 WBC 7.1 hemoglobin 9.1 hematocrit 27.6 sodium 139 potassium 3.9 glucose 80 BUN 25 creatinine 0.87 calcium 8.4 01/07/13 sodium 137 potassium 3.4 glucose 105 BUN 16 creatinine 0.7-6 calcium 8.8 WBC 8.0 hemoglobin 9.6 hematocrit 28.4  ASSESSMENT/PLAN:  Close right heel fracture status post repair -continue PT and OT  Trigeminal neuralgia - no complaints  Dementia - stable  Constipation - no complaints   Anemia  - stable    CPT CODE: 78295

## 2013-05-09 ENCOUNTER — Encounter: Payer: Self-pay | Admitting: Neurology

## 2013-05-09 ENCOUNTER — Ambulatory Visit (INDEPENDENT_AMBULATORY_CARE_PROVIDER_SITE_OTHER): Payer: Medicare Other | Admitting: Neurology

## 2013-05-09 VITALS — BP 153/69 | HR 72 | Ht 64.5 in | Wt 130.0 lb

## 2013-05-09 DIAGNOSIS — H9193 Unspecified hearing loss, bilateral: Secondary | ICD-10-CM

## 2013-05-09 DIAGNOSIS — F0280 Dementia in other diseases classified elsewhere without behavioral disturbance: Secondary | ICD-10-CM

## 2013-05-09 DIAGNOSIS — H9190 Unspecified hearing loss, unspecified ear: Secondary | ICD-10-CM

## 2013-05-09 DIAGNOSIS — G500 Trigeminal neuralgia: Secondary | ICD-10-CM

## 2013-05-09 MED ORDER — MEMANTINE HCL 10 MG PO TABS: 10.0000 mg | ORAL_TABLET | Freq: Two times a day (BID) | ORAL | Status: DC

## 2013-05-09 MED ORDER — DONEPEZIL HCL 10 MG PO TABS: 10.0000 mg | ORAL_TABLET | Freq: Every morning | ORAL | Status: DC

## 2013-05-09 NOTE — Progress Notes (Signed)
[facility]  PATIENT: Jodi Beltran DOB: 1921/01/25  HISTORICAL  Ms. Canniff is a 79 years old right-handed Caucasian female, referred by her primary care physician, accompanied by her son, and caregiver at today's visit, she was patient of Dr. love, last clinical visit was in November 2013  She had a past medical history of right trigeminal neuralgia, symptoms started in 1995, first evaluated in 1996, has been taking Tegretol 100 mg 3 times a day, sometimes even only one 100 mg tablet, she is doing very well, there was no recurrent right facial pain. She also has gradual onset mild to moderate memory trouble, she has CNA 7 days a week, she goes to her son's house for night time.  MRI of the brain in 2007 showed cerebral and pontine small vessel disease.  She has no recurrent left facial pain doing very well, just want to have her medicine refilled.     REVIEW OF SYSTEMS: Full 14 system review of systems performed and notable only for hearing loss, memory loss,  ALLERGIES: Allergies  Allergen Reactions  . Other Other (See Comments)    Maple- reaction unknown    HOME MEDICATIONS: Outpatient Prescriptions Prior to Visit  Medication Sig Dispense Refill  . aspirin EC 325 MG tablet Take 1 tablet (325 mg total) by mouth daily.  30 tablet  0  . carbamazepine (TEGRETOL) 100 MG chewable tablet Chew 1 tablet (100 mg total) by mouth every 4 (four) hours as needed. For pain  540 tablet  1  . Cholecalciferol (VITAMIN D) 2000 UNITS CAPS Take 1 capsule by mouth daily.      . traMADol (ULTRAM) 50 MG tablet Take 1 tablet (50 mg total) by mouth every 8 (eight) hours as needed.  30 tablet  0  . traMADol-acetaminophen (ULTRACET) 37.5-325 MG per tablet Take 1 tablet by mouth every 8 (eight) hours as needed for pain.  90 tablet  0  . donepezil (ARICEPT) 10 MG tablet Take 1 tablet (10 mg total) by mouth every morning.  90 tablet  1  . memantine (NAMENDA) 10 MG tablet Take 1 tablet  (10 mg total) by mouth 2 (two) times daily.  180 tablet  1   No facility-administered medications prior to visit.    PAST MEDICAL HISTORY: Past Medical History  Diagnosis Date  . Dementia   . Tic douloureux   . Arthritis   . Tic douloureux   . Alzheimer disease     PAST SURGICAL HISTORY: Past Surgical History  Procedure Laterality Date  . Tumors removal benign    . Back surgery    . Bladder tact    . Abdominal hysterectomy    . Hip pinning,cannulated Right 01/05/2013    Procedure: CANNULATED HIP PINNING RIGHT HIP;  Surgeon: Venita Lick, MD;  Location: [facility] OR;  Service: Orthopedics;  Laterality: Right;    FAMILY HISTORY: Family History  Problem Relation Age of Onset  . Lung cancer Mother     SOCIAL HISTORY:  History   Social History  . Marital Status: Widowed    Spouse Name: N/A    Number of Children: 2  . Years of Education: 9th   Occupational History  .      Retired   Social History Main Topics  . Smoking status: Never Smoker   . Smokeless tobacco: Never Used  . Alcohol Use: No  . Drug Use: No  . Sexual Activity: No   Other Topics Concern  . Not  on file   Social History Narrative   Patient goes back and fourth between sons house and hers.   Retired.   Education. 9 th grade education.   Right handed.   Caffeine- two cups daily.    Patient is  a widow.    PHYSICAL EXAM   Filed Vitals:   05/09/13 1323  BP: 153/69  Pulse: 72  Height: 5' 4.5" (1.638 m)  Weight: 130 lb (58.968 kg)    Body mass index is 21.98 kg/(m^2).   Generalized: In no acute distress  Neck: Supple, no carotid bruits   Cardiac: Regular rate rhythm  Pulmonary: Clear to auscultation bilaterally  Musculoskeletal: No deformity  Neurological examination  Mentation: hard of hearing, depend on her son and care giver to answer questions, MMSE 16/30, she is not oriented to time, place, missed 3/3 recalls, could not copy WORLD backwards.  Cranial nerve II-XII: Pupils were  equal round reactive to light extraocular movements were full, visual field were full on confrontational test. facial sensation and strength were normal. hearing was intact to finger rubbing bilaterally. Uvula tongue midline.  head turning and shoulder shrug and were normal and symmetric.Tongue protrusion into cheek strength was normal.  Motor: normal tone, bulk and strength.  Sensory: Intact to fine touch, pinprick, preserved vibratory sensation, and proprioception at toes.  Coordination: Normal finger to nose, heel-to-shin bilaterally there was no truncal ataxia  Gait: Rising up from seated position without assistance, normal stance, without trunk ataxia, moderate stride, good arm swing, smooth turning, able to perform tiptoe, and heel walking without difficulty.   Romberg signs: Negative  Deep tendon reflexes: Brachioradialis 2/2, biceps 2/2, triceps 2/2, patellar 2/2, Achilles 2/2, plantar responses were flexor bilaterally.   DIAGNOSTIC DATA (LABS, IMAGING, TESTING) - I reviewed patient records, labs, notes, testing and imaging myself where available.  Lab Results  Component Value Date   WBC 8.0 01/07/2013   HGB 9.6* 01/07/2013   HCT 28.4* 01/07/2013   MCV 91.9 01/07/2013   PLT 200 01/07/2013      Component Value Date/Time   NA 137 01/07/2013 0440   K 3.4* 01/07/2013 0440   CL 102 01/07/2013 0440   CO2 24 01/07/2013 0440   GLUCOSE 105* 01/07/2013 0440   BUN 16 01/07/2013 0440   CREATININE 0.76 01/07/2013 0440   CALCIUM 8.8 01/07/2013 0440   PROT 6.7 09/20/2012 1556   ALBUMIN 2.9* 09/20/2012 1556   AST 15 09/20/2012 1556   ALT 9 09/20/2012 1556   ALKPHOS 86 09/20/2012 1556   BILITOT 0.2* 09/20/2012 1556   GFRNONAA 73* 01/07/2013 0440   GFRAA 84* 01/07/2013 0440    ASSESSMENT AND PLAN 79 years old Caucasian female, with past medical history of left trigeminal neuralgia, dementia, doing very well  Continue current medications, Aricept 10 mg every day, Namenda 10 mg twice a day, Tegretol 100 mg  3 times a day Return to clinic in one year, call office for new issues.          Levert Feinstein, M.D. Ph.D.  [facility] [address], [address] [place], [place] [address] [phone]

## 2013-05-26 ENCOUNTER — Other Ambulatory Visit: Payer: Self-pay

## 2013-07-11 ENCOUNTER — Emergency Department (HOSPITAL_COMMUNITY)
Admission: EM | Admit: 2013-07-11 | Discharge: 2013-07-11 | Disposition: A | Discharge status: Home / Self Care | Payer: Medicare Other | Attending: Emergency Medicine | Admitting: Emergency Medicine

## 2013-07-11 ENCOUNTER — Encounter (HOSPITAL_COMMUNITY): Payer: Self-pay | Admitting: Emergency Medicine

## 2013-07-11 ENCOUNTER — Emergency Department (HOSPITAL_COMMUNITY): Payer: Medicare Other

## 2013-07-11 DIAGNOSIS — F0280 Dementia in other diseases classified elsewhere without behavioral disturbance: Secondary | ICD-10-CM | POA: Insufficient documentation

## 2013-07-11 DIAGNOSIS — Z79899 Other long term (current) drug therapy: Secondary | ICD-10-CM | POA: Insufficient documentation

## 2013-07-11 DIAGNOSIS — G309 Alzheimer's disease, unspecified: Secondary | ICD-10-CM | POA: Insufficient documentation

## 2013-07-11 DIAGNOSIS — Z7982 Long term (current) use of aspirin: Secondary | ICD-10-CM | POA: Insufficient documentation

## 2013-07-11 DIAGNOSIS — R112 Nausea with vomiting, unspecified: Secondary | ICD-10-CM | POA: Insufficient documentation

## 2013-07-11 DIAGNOSIS — R079 Chest pain, unspecified: Secondary | ICD-10-CM | POA: Insufficient documentation

## 2013-07-11 DIAGNOSIS — N390 Urinary tract infection, site not specified: Secondary | ICD-10-CM

## 2013-07-11 DIAGNOSIS — M129 Arthropathy, unspecified: Secondary | ICD-10-CM | POA: Insufficient documentation

## 2013-07-11 LAB — URINALYSIS, ROUTINE W REFLEX MICROSCOPIC
Bilirubin Urine: NEGATIVE
Glucose, UA: NEGATIVE mg/dL
Ketones, ur: NEGATIVE mg/dL
Nitrite: POSITIVE — AB
Protein, ur: 30 mg/dL — AB
Specific Gravity, Urine: 1.019 (ref 1.005–1.030)
Urobilinogen, UA: 0.2 mg/dL (ref 0.0–1.0)
pH: 6 (ref 5.0–8.0)

## 2013-07-11 LAB — CBC WITH DIFFERENTIAL/PLATELET
Basophils Absolute: 0 10*3/uL (ref 0.0–0.1)
Basophils Relative: 0 % (ref 0–1)
Eosinophils Absolute: 0 K/uL (ref 0.0–0.7)
Eosinophils Relative: 0 % (ref 0–5)
HCT: 37 % (ref 36.0–46.0)
Hemoglobin: 12.1 g/dL (ref 12.0–15.0)
Lymphocytes Relative: 3 % — ABNORMAL LOW (ref 12–46)
Lymphs Abs: 0.3 K/uL — ABNORMAL LOW (ref 0.7–4.0)
MCH: 31.1 pg (ref 26.0–34.0)
MCHC: 32.7 g/dL (ref 30.0–36.0)
MCV: 95.1 fL (ref 78.0–100.0)
Monocytes Absolute: 1 10*3/uL (ref 0.1–1.0)
Monocytes Relative: 8 % (ref 3–12)
Neutro Abs: 11.5 10*3/uL — ABNORMAL HIGH (ref 1.7–7.7)
Neutrophils Relative %: 89 % — ABNORMAL HIGH (ref 43–77)
Platelets: 305 10*3/uL (ref 150–400)
RBC: 3.89 MIL/uL (ref 3.87–5.11)
RDW: 13.6 % (ref 11.5–15.5)
WBC: 12.9 10*3/uL — ABNORMAL HIGH (ref 4.0–10.5)

## 2013-07-11 LAB — COMPREHENSIVE METABOLIC PANEL WITH GFR
ALT: 21 U/L (ref 0–35)
AST: 29 U/L (ref 0–37)
CO2: 28 meq/L (ref 19–32)
Chloride: 99 meq/L (ref 96–112)
GFR calc non Af Amer: 54 mL/min — ABNORMAL LOW (ref >90)
Potassium: 4.4 meq/L (ref 3.5–5.1)
Sodium: 138 meq/L (ref 135–145)
Total Bilirubin: 0.3 mg/dL (ref 0.3–1.2)

## 2013-07-11 LAB — CG4 I-STAT (LACTIC ACID): Lactic Acid, Venous: 1.9 mmol/L (ref 0.5–2.2)

## 2013-07-11 LAB — URINE MICROSCOPIC-ADD ON

## 2013-07-11 LAB — COMPREHENSIVE METABOLIC PANEL
Albumin: 3.5 g/dL (ref 3.5–5.2)
Alkaline Phosphatase: 123 U/L — ABNORMAL HIGH (ref 39–117)
BUN: 34 mg/dL — ABNORMAL HIGH (ref 6–23)
Calcium: 9.5 mg/dL (ref 8.4–10.5)
Creatinine, Ser: 0.91 mg/dL (ref 0.50–1.10)
GFR calc Af Amer: 62 mL/min — ABNORMAL LOW (ref >90)
Glucose, Bld: 144 mg/dL — ABNORMAL HIGH (ref 70–99)
Total Protein: 7.6 g/dL (ref 6.0–8.3)

## 2013-07-11 MED ORDER — ONDANSETRON HCL 4 MG/2ML IJ SOLN
4.0000 mg | Freq: Once | INTRAMUSCULAR | Status: AC
  Administered 2013-07-11: 4 mg via INTRAVENOUS
  Filled 2013-07-11: qty 2

## 2013-07-11 MED ORDER — ACETAMINOPHEN 325 MG PO TABS
650.0000 mg | ORAL_TABLET | Freq: Four times a day (QID) | ORAL | Status: DC | PRN
  Administered 2013-07-11: 650 mg via ORAL
  Filled 2013-07-11: qty 2

## 2013-07-11 MED ORDER — SODIUM CHLORIDE 0.9 % IV SOLN
1000.0000 mL | Freq: Once | INTRAVENOUS | Status: AC
  Administered 2013-07-11: 1000 mL via INTRAVENOUS

## 2013-07-11 MED ORDER — CEPHALEXIN 500 MG PO CAPS: 500.0000 mg | ORAL_CAPSULE | Freq: Four times a day (QID) | ORAL | Status: DC

## 2013-07-11 MED ORDER — SODIUM CHLORIDE 0.9 % IV SOLN
1000.0000 mL | INTRAVENOUS | Status: DC
  Administered 2013-07-11: 1000 mL via INTRAVENOUS

## 2013-07-11 MED ORDER — DEXTROSE 5 % IV SOLN
1.0000 g | Freq: Once | INTRAVENOUS | Status: AC
  Administered 2013-07-11: 1 g via INTRAVENOUS
  Filled 2013-07-11: qty 10

## 2013-07-11 NOTE — ED Provider Notes (Signed)
CSN: [id number]     Arrival date & time 07/11/13  1256 History   First MD Initiated Contact with Patient 07/11/13 1346     Chief Complaint  Patient presents with  . Fever  . Emesis   (Consider location/radiation/quality/duration/timing/severity/associated sxs/prior Treatment) Patient is a 79 y.o. female presenting with fever and vomiting. The history is provided by the patient, a relative and a caregiver.  Fever Temp source:  Subjective Duration:  12 hours Timing:  Constant Progression:  Worsening Chronicity:  New Associated symptoms: chest pain, chills, nausea and vomiting   Associated symptoms: no confusion, no cough and no diarrhea   Chest pain:    Quality:  Sharp   Severity:  Mild   Onset quality:  Gradual   Timing:  Constant   Chronicity:  New Vomiting:    Number of occurrences:  3   Severity:  Mild   Duration:  8 hours   Progression:  Partially resolved Risk factors: no recent travel and no sick contacts   Emesis Associated symptoms: chills   Associated symptoms: no diarrhea     Past Medical History  Diagnosis Date  . Dementia   . Tic douloureux   . Arthritis   . Tic douloureux   . Alzheimer disease    Past Surgical History  Procedure Laterality Date  . Tumors removal benign    . Back surgery    . Bladder tact    . Abdominal hysterectomy    . Hip pinning,cannulated Right 01/05/2013    Procedure: CANNULATED HIP PINNING RIGHT HIP;  Surgeon: Venita Lick, MD;  Location: [facility] OR;  Service: Orthopedics;  Laterality: Right;   Family History  Problem Relation Age of Onset  . Lung cancer Mother    History  Substance Use Topics  . Smoking status: Never Smoker   . Smokeless tobacco: Never Used  . Alcohol Use: No   OB History   Grav Para Term Preterm Abortions TAB SAB Ect Mult Living                 Review of Systems  Unable to perform ROS: Dementia  Constitutional: Positive for fever and chills.  Respiratory: Negative for cough.   Cardiovascular:  Positive for chest pain.  Gastrointestinal: Positive for nausea and vomiting. Negative for diarrhea.  Psychiatric/Behavioral: Negative for confusion.    Allergies  Other  Home Medications   Current Outpatient Rx  Name  Route  Sig  Dispense  Refill  . aspirin EC 325 MG tablet   Oral   Take 1 tablet (325 mg total) by mouth daily.   30 tablet   0   . carbamazepine (TEGRETOL) 100 MG chewable tablet   Oral   Chew 1 tablet (100 mg total) by mouth every 4 (four) hours as needed. For pain   540 tablet   1   . donepezil (ARICEPT) 10 MG tablet   Oral   Take 1 tablet (10 mg total) by mouth every morning.   90 tablet   3   . memantine (NAMENDA) 10 MG tablet   Oral   Take 10 mg by mouth daily.         . Multiple Vitamins-Minerals (MULTIVITAMIN WITH MINERALS) tablet   Oral   Take 1 tablet by mouth daily.         . traMADol (ULTRAM) 50 MG tablet   Oral   Take 1 tablet (50 mg total) by mouth every 8 (eight) hours as needed.   30 tablet  0    BP 142/67  Pulse 83  Temp(Src) 99 F (37.2 C) (Oral)  Resp 15  Ht 5' 4.57" (1.64 m)  Wt 120 lb (54.432 kg)  BMI 20.24 kg/m2  SpO2 95% Physical Exam  Nursing note and vitals reviewed. Constitutional: She appears well-developed and well-nourished.  HENT:  Head: Normocephalic and atraumatic.  Eyes: Conjunctivae are normal. No scleral icterus.  Neck: Normal range of motion. Neck supple.  Cardiovascular: Normal rate, regular rhythm and intact distal pulses.   No murmur heard. Pulmonary/Chest: Effort normal. She has no wheezes. She has no rales.  Abdominal: Soft. She exhibits no distension. There is no tenderness. There is no rebound.  Musculoskeletal: Normal range of motion.  Neurological: She is alert. She exhibits normal muscle tone. Coordination normal.  Skin: Skin is warm and dry. No rash noted.  Psychiatric: She has a normal mood and affect.    ED Course  Procedures (including critical care time) Labs Review Labs  Reviewed  CBC WITH DIFFERENTIAL - Abnormal; Notable for the following:    WBC 12.9 (*)    Neutrophils Relative % 89 (*)    Neutro Abs 11.5 (*)    Lymphocytes Relative 3 (*)    Lymphs Abs 0.3 (*)    All other components within normal limits  COMPREHENSIVE METABOLIC PANEL - Abnormal; Notable for the following:    Glucose, Bld 144 (*)    BUN 34 (*)    Alkaline Phosphatase 123 (*)    GFR calc non Af Amer 54 (*)    GFR calc Af Amer 62 (*)    All other components within normal limits  CULTURE, BLOOD (ROUTINE X 2)  CULTURE, BLOOD (ROUTINE X 2)  URINE CULTURE  URINALYSIS, ROUTINE W REFLEX MICROSCOPIC  CG4 I-STAT (LACTIC ACID)   Imaging Review Dg Chest Port 1 View  07/11/2013   CLINICAL DATA:  Fever.  Vomiting.  EXAM: PORTABLE CHEST - 1 VIEW  COMPARISON:  01/05/2013.  FINDINGS: Suboptimal inspiration accounts for crowded bronchovascular markings diffusely and atelectasis in the bases, left greater than right, and accentuates the cardiac silhouette. Taking this into account, cardiac silhouette upper normal in size at, unchanged. Thoracic aorta atherosclerotic, unchanged. Hilar and mediastinal contours otherwise unremarkable. Lungs otherwise clear. Normal pulmonary vascularity. Calcified tracheobronchial cartilages.  IMPRESSION: Suboptimal inspiration accounts for bibasilar atelectasis, left greater than right. No acute cardiopulmonary disease otherwise.   Electronically Signed   By: Hulan Saas M.D.   On: 07/11/2013 14:26    EKG Interpretation   None      RA sat is 93% and I interpret to be borderline   3:48 PM Pt is requesting to try to drink some fluids.  Will attempt.  UA is still pending.  CXR shows no overt infiltrate.  Labs, blood cultures sent.  Nausea seems improved after IVF's and IV zofran.    11:55PM Pt continues to feel well.  Tolerated po's.  Waiting on UA.  Despite age, pt appears well, non toxic, may be able to go home where she has a caregiver and family attention and  can return if worse.  Pt is signed out to Dr. Manus Gunning to follow up on UA to determine need for abx or not.  Family instructed on need for attention at home and close follow up.  Rx for nausea can be given.      MDM  Impression: Fever Vomiting, improved  Pt is pleasant, no sig pain, febrile, tachycardic, but stable elevated BP.  Pt with  h/o dementia, lives at home, sudden fever, rigors this AM, had 3 episodes of emesis, no diarrhea.  Energy level diminished.  Has had flu immunization already.      Gavin Pound. Oletta Lamas, MD 07/12/13 [phone]

## 2013-07-11 NOTE — ED Notes (Signed)
Pt given a soda per EDP order

## 2013-07-11 NOTE — ED Notes (Addendum)
Pt tolerated fluid challenge well, no nausea or vomiting. Provider at the bedside.

## 2013-07-11 NOTE — ED Notes (Signed)
Provider at the bedside.  

## 2013-07-11 NOTE — ED Notes (Signed)
Son stated, last night she felt a little bad not much.  This morning she was shivering all over after her shower and it was more than a cold shiver.per her CNA. That stays with her.  The care giver during the day.

## 2013-07-11 NOTE — ED Provider Notes (Signed)
Assumed care of patient from Dr. Oletta Lamas. Awaiting urinalysis to evaluate fever. Patient does not appear to be septic and has stable vitals. She is at her baseline mental status per per son. Urinalysis is positive. We'll treat urinary tract infection. Patient tolerating by mouth in the ED. Her son would like to take her home. Discussed with Dr. Timothy Lasso who is on call for Dr. Jarold Motto. He agrees patient can likely be treated as an outpatient. She will have a followup appointment at 9 AM on December 24.  Patient is instructed to return with confusion, vomiting, worsening fever or any other change.  BP 129/88  Pulse 86  Temp(Src) 98.2 F (36.8 C) (Oral)  Resp 18  Ht 5' 4.57" (1.64 m)  Wt 120 lb (54.432 kg)  BMI 20.24 kg/m2  SpO2 100%   Glynn Octave, MD 07/11/13 [phone]

## 2013-07-11 NOTE — ED Notes (Signed)
NOTIFIED DR. GHIM OF PATIENTS LAB RESULTS OF CG4+ = 1.24mmoI/L , 13:30PM, 07/11/2013.

## 2013-07-12 ENCOUNTER — Telehealth (HOSPITAL_COMMUNITY): Payer: Self-pay | Admitting: *Deleted

## 2013-07-12 NOTE — ED Notes (Signed)
Blood cultures: in 1st set of aerobic bottle there were Gram - rods.  Per Lab tech 2 sets of cultures were drawn this is the first to grow anything. Brought to EDP

## 2013-07-12 NOTE — ED Notes (Addendum)
Per MD Fayrene Fearing, Please call pt and recommend that she return for re-eval, d/t Gram negative rods in aerobic bottle of first set of blood cultures.

## 2013-07-13 LAB — URINE CULTURE: Colony Count: >=100000

## 2013-07-14 LAB — CULTURE, BLOOD (ROUTINE X 2)

## 2013-07-17 LAB — CULTURE, BLOOD (ROUTINE X 2): Culture: NO GROWTH

## 2014-05-11 ENCOUNTER — Other Ambulatory Visit: Payer: Self-pay | Admitting: Neurology

## 2014-05-11 ENCOUNTER — Ambulatory Visit: Payer: Medicare Other | Admitting: Neurology

## 2014-08-09 ENCOUNTER — Other Ambulatory Visit: Payer: Self-pay | Admitting: Neurology

## 2014-08-09 NOTE — Telephone Encounter (Signed)
Patient has not been seen in over 1 year, no showed last appt.  I called, caregiver says they will call back tomorrow to schedule appt.

## 2014-08-21 ENCOUNTER — Other Ambulatory Visit: Payer: Self-pay

## 2014-08-21 ENCOUNTER — Telehealth: Payer: Self-pay | Admitting: Neurology

## 2014-08-21 MED ORDER — CARBAMAZEPINE 100 MG PO CHEW
100.0000 mg | CHEWABLE_TABLET | ORAL | Status: DC | PRN
Start: 2014-08-21 — End: 2014-08-21

## 2014-08-21 MED ORDER — CARBAMAZEPINE 100 MG PO CHEW: 100.0000 mg | CHEWABLE_TABLET | Freq: Three times a day (TID) | ORAL | Status: DC

## 2014-08-21 MED ORDER — MEMANTINE HCL 10 MG PO TABS: 10.0000 mg | ORAL_TABLET | Freq: Two times a day (BID) | ORAL | Status: DC

## 2014-08-21 MED ORDER — DONEPEZIL HCL 10 MG PO TABS: 10.0000 mg | ORAL_TABLET | Freq: Every morning | ORAL | Status: DC

## 2014-08-21 NOTE — Telephone Encounter (Signed)
Rx's have been sent. 

## 2014-08-21 NOTE — Telephone Encounter (Signed)
Pt's son called back needing medication refilled patient has appointment scheduled for 09/22/14.

## 2014-08-21 NOTE — Telephone Encounter (Signed)
Last OV note says: Tegretol 100 mg 3 times a day I called pharmacy and spoke with Dondra Praderominique.  She verified Rx.

## 2014-08-25 ENCOUNTER — Other Ambulatory Visit: Payer: Self-pay | Admitting: Neurology

## 2014-09-22 ENCOUNTER — Encounter: Payer: Self-pay | Admitting: Neurology

## 2014-09-22 ENCOUNTER — Ambulatory Visit (INDEPENDENT_AMBULATORY_CARE_PROVIDER_SITE_OTHER): Payer: Medicare Other | Admitting: Neurology

## 2014-09-22 VITALS — BP 138/82 | HR 72 | Ht 64.5 in | Wt 138.0 lb

## 2014-09-22 DIAGNOSIS — G500 Trigeminal neuralgia: Secondary | ICD-10-CM

## 2014-09-22 DIAGNOSIS — F0280 Dementia in other diseases classified elsewhere without behavioral disturbance: Secondary | ICD-10-CM

## 2014-09-22 DIAGNOSIS — G309 Alzheimer's disease, unspecified: Secondary | ICD-10-CM

## 2014-09-22 MED ORDER — GABAPENTIN 100 MG PO CAPS: 100.0000 mg | ORAL_CAPSULE | Freq: Three times a day (TID) | ORAL | Status: DC

## 2014-09-22 NOTE — Progress Notes (Signed)
[facility]  PATIENT: Jodi Beltran DOB: March 14, 1921  HISTORICAL  Ms. Ayad is a 81 years old right-handed Caucasian female, referred by her primary care physician, accompanied by her son, and caregiver at today's visit, she was patient of Dr. love, last clinical visit was in November 2013  She had a past medical history of right trigeminal neuralgia, symptoms started in 1995, first evaluated in 1996, has been taking Tegretol 100 mg daily, sometimes extra 100 mg tablet, she is doing very well, there was no recurrent right facial pain. She also has gradual onset mild to moderate memory trouble, she has CNA 7 days a week, she goes to her son's house for night time.  MRI of the brain in 2007 showed cerebral and pontine small vessel disease.  She has no recurrent left facial pain doing very well, just want to have her medicine refilled.   UPDATE March 4th 2016: Last visit was in 04/2013, she lives at home, accompanied by her son and care give at today's visit.  She is getting more forgetful, today MMSE 21/30, not oriented to time and place, missed 3/3 recalls,  She denies significant facial pain, has been on Tegretol 100 mg 1 tablets daily for many years, extra doses as needed, had a history of right hip fracture require surgical fixation,   REVIEW OF SYSTEMS: Full 14 system review of systems performed and notable only for hearing loss, memory loss,  ALLERGIES: Allergies  Allergen Reactions  . Other Other (See Comments)    Maple tree pollen - reaction unknown    HOME MEDICATIONS: Outpatient Prescriptions Prior to Visit  Medication Sig Dispense Refill  . aspirin EC 325 MG tablet Take 1 tablet (325 mg total) by mouth daily. 30 tablet 0  . carbamazepine (TEGRETOL) 100 MG chewable tablet Chew 1 tablet (100 mg total) by mouth 3 (three) times daily. As needed for pain 270 tablet 0  . donepezil (ARICEPT) 10 MG tablet Take 1 tablet (10 mg total) by mouth every morning.  90 tablet 0  . memantine (NAMENDA) 10 MG tablet TAKE 1 TABLET TWICE A DAY 180 tablet 0  . Multiple Vitamins-Minerals (MULTIVITAMIN WITH MINERALS) tablet Take 1 tablet by mouth daily.    . cephALEXin (KEFLEX) 500 MG capsule Take 1 capsule (500 mg total) by mouth 4 (four) times daily. 40 capsule 0  . memantine (NAMENDA) 10 MG tablet Take 1 tablet (10 mg total) by mouth 2 (two) times daily. 180 tablet 0  . traMADol (ULTRAM) 50 MG tablet Take 1 tablet (50 mg total) by mouth every 8 (eight) hours as needed. 30 tablet 0   No facility-administered medications prior to visit.    PAST MEDICAL HISTORY: Past Medical History  Diagnosis Date  . Dementia   . Tic douloureux   . Arthritis   . Tic douloureux   . Alzheimer disease     PAST SURGICAL HISTORY: Past Surgical History  Procedure Laterality Date  . Tumors removal benign    . Back surgery    . Bladder tact    . Abdominal hysterectomy    . Hip pinning,cannulated Right 01/05/2013    Procedure: CANNULATED HIP PINNING RIGHT HIP;  Surgeon: Venita Lick, MD;  Location: [facility] OR;  Service: Orthopedics;  Laterality: Right;    FAMILY HISTORY: Family History  Problem Relation Age of Onset  . Lung cancer Mother     SOCIAL HISTORY:  History   Social History  . Marital Status: Widowed  Spouse Name: N/A  . Number of Children: 2  . Years of Education: 9th   Occupational History  .      Retired   Social History Main Topics  . Smoking status: Never Smoker   . Smokeless tobacco: Never Used  . Alcohol Use: No  . Drug Use: No  . Sexual Activity: No   Other Topics Concern  . Not on file   Social History Narrative   Patient goes back and fourth between sons house and hers.   Retired.   Education. 9 th grade education.   Right handed.   Caffeine- two cups daily.    Patient is  a widow.    PHYSICAL EXAM   Filed Vitals:   09/22/14 1156  BP: 138/82  Pulse: 72  Height: 5' 4.5" (1.638 m)  Weight: 138 lb (62.596 kg)    Body  mass index is 23.33 kg/(m^2).  PHYSICAL EXAMNIATION:  Gen: NAD, conversant, well nourised, obese, well groomed                     Cardiovascular: Regular rate rhythm, no peripheral edema, warm, nontender. Eyes: Conjunctivae clear without exudates or hemorrhage Neck: Supple, no carotid bruise. Pulmonary: Clear to auscultation bilaterally   NEUROLOGICAL EXAM:  MENTAL STATUS: Speech:    Speech is normal; fluent and spontaneous with normal comprehension.  Cognition:  MMSE 21/30, she is not oriented to time and place, missed 3 out of 3 recalls,  CRANIAL NERVES: CN II: Visual fields are full to confrontation. Fundoscopic exam is normal with sharp discs and no vascular changes. Venous pulsations are present bilaterally. Pupils are 4 mm and briskly reactive to light. Visual acuity is 20/20 bilaterally. CN III, IV, VI: extraocular movement are normal. No ptosis. CN V: Facial sensation is intact to pinprick in all 3 divisions bilaterally. Corneal responses are intact.  CN VII: Face is symmetric with normal eye closure and smile. CN VIII: Hearing is normal to rubbing fingers CN IX, X: Palate elevates symmetrically. Phonation is normal. CN XI: Head turning and shoulder shrug are intact CN XII: Tongue is midline with normal movements and no atrophy.  MOTOR: There is no pronator drift of out-stretched arms. Muscle bulk and tone are normal. Muscle strength is normal.   Shoulder abduction Shoulder external rotation Elbow flexion Elbow extension Wrist flexion Wrist extension Finger abduction Hip flexion Knee flexion Knee extension Ankle dorsi flexion Ankle plantar flexion  R 5 5 5 5 5 5 5 5 5 5 5 5   L 5 5 5 5 5 5 5 5 5 5 5 5     REFLEXES: Reflexes are 2+ and symmetric at the biceps, triceps, knees, and ankles. Plantar responses are flexor.  SENSORY: Light touch, pinprick, position sense, and vibration sense are intact in fingers and toes.  COORDINATION: Rapid alternating movements and fine  finger movements are intact. There is no dysmetria on finger-to-nose and heel-knee-shin. There are no abnormal or extraneous movements.   GAIT/STANCE: Posture is normal. Gait is steady with normal steps, base, arm swing, and turning. Heel and toe walking are normal. Tandem gait is normal.  Romberg is absent.   DIAGNOSTIC DATA (LABS, IMAGING, TESTING) - I reviewed patient records, labs, notes, testing and imaging myself where available.  Lab Results  Component Value Date   WBC 12.9* 07/11/2013   HGB 12.1 07/11/2013   HCT 37.0 07/11/2013   MCV 95.1 07/11/2013   PLT 305 07/11/2013      Component  Value Date/Time   NA 138 07/11/2013 1405   K 4.4 07/11/2013 1405   CL 99 07/11/2013 1405   CO2 28 07/11/2013 1405   GLUCOSE 144* 07/11/2013 1405   BUN 34* 07/11/2013 1405   CREATININE 0.91 07/11/2013 1405   CALCIUM 9.5 07/11/2013 1405   PROT 7.6 07/11/2013 1405   ALBUMIN 3.5 07/11/2013 1405   AST 29 07/11/2013 1405   ALT 21 07/11/2013 1405   ALKPHOS 123* 07/11/2013 1405   BILITOT 0.3 07/11/2013 1405   GFRNONAA 54* 07/11/2013 1405   GFRAA 62* 07/11/2013 1405    ASSESSMENT AND PLAN 81 years old Caucasian female, with past medical history of left trigeminal neuralgia, dementia, doing very well  1, trigeminal neuralgia, doing well on Tegretol, but has potential side effect of osteopenia, enzyme inducer, after discussed with patient's family, we decided to switch her to gabapentin up to 100 mg 3 times a day, call office for recurrent facial pain 2, dementia, Mini-Mental Status Examination is 21 out of 30 today, doing well on Aricept, Namenda, 3 return to clinic in one year    Levert Feinstein, M.D. Ph.D.  [facility] [address] [place], [place] [address] [phone]

## 2014-11-09 ENCOUNTER — Other Ambulatory Visit: Payer: Self-pay | Admitting: Neurology

## 2015-01-15 ENCOUNTER — Other Ambulatory Visit: Payer: Self-pay

## 2015-02-13 ENCOUNTER — Telehealth: Payer: Self-pay | Admitting: Neurology

## 2015-02-13 ENCOUNTER — Other Ambulatory Visit: Payer: Self-pay | Admitting: *Deleted

## 2015-02-13 ENCOUNTER — Encounter: Payer: Self-pay | Admitting: *Deleted

## 2015-02-13 DIAGNOSIS — G500 Trigeminal neuralgia: Secondary | ICD-10-CM

## 2015-02-13 MED ORDER — CARBAMAZEPINE 100 MG PO CHEW
100.0000 mg | CHEWABLE_TABLET | Freq: Four times a day (QID) | ORAL | Status: DC | PRN
Start: 2015-02-13 — End: 2015-09-25

## 2015-02-13 NOTE — Telephone Encounter (Signed)
Jodi Beltran returned Jodi Beltran's phone call.

## 2015-02-13 NOTE — Telephone Encounter (Signed)
Darel Hong returned Nicholson RN's call again. Please call and advise.

## 2015-02-13 NOTE — Telephone Encounter (Signed)
The Patients daughter called and requested to speak with someone about her medication Rx. CARBARANAZINE (sp?).  She would like to change her mother to this medication. Please call and advise.

## 2015-02-13 NOTE — Telephone Encounter (Signed)
Left a message for Darel Hong to return my call.

## 2015-02-13 NOTE — Telephone Encounter (Signed)
Patient was unable to tolerate gabapentin and her family restarted carbamazepine at the previous dose of , QID PRN.  They are requesting a new rx be sent to [facility].  Ok per Dr. Terrace Arabia to refill.

## 2015-09-15 ENCOUNTER — Emergency Department (HOSPITAL_COMMUNITY): Payer: Medicare Other

## 2015-09-15 ENCOUNTER — Inpatient Hospital Stay (HOSPITAL_COMMUNITY)
Admission: EM | Admit: 2015-09-15 | Discharge: 2015-09-18 | DRG: 309 | Disposition: A | Discharge status: Home / Self Care | Payer: Medicare Other | Attending: Internal Medicine | Admitting: Internal Medicine

## 2015-09-15 ENCOUNTER — Encounter (HOSPITAL_COMMUNITY): Payer: Self-pay | Admitting: Emergency Medicine

## 2015-09-15 DIAGNOSIS — R519 Headache, unspecified: Secondary | ICD-10-CM

## 2015-09-15 DIAGNOSIS — F0280 Dementia in other diseases classified elsewhere without behavioral disturbance: Secondary | ICD-10-CM | POA: Diagnosis present

## 2015-09-15 DIAGNOSIS — I10 Essential (primary) hypertension: Secondary | ICD-10-CM | POA: Diagnosis present

## 2015-09-15 DIAGNOSIS — Z66 Do not resuscitate: Secondary | ICD-10-CM | POA: Diagnosis present

## 2015-09-15 DIAGNOSIS — E860 Dehydration: Secondary | ICD-10-CM | POA: Diagnosis present

## 2015-09-15 DIAGNOSIS — G500 Trigeminal neuralgia: Secondary | ICD-10-CM | POA: Diagnosis present

## 2015-09-15 DIAGNOSIS — N390 Urinary tract infection, site not specified: Secondary | ICD-10-CM | POA: Diagnosis not present

## 2015-09-15 DIAGNOSIS — B9620 Unspecified Escherichia coli [E. coli] as the cause of diseases classified elsewhere: Secondary | ICD-10-CM | POA: Diagnosis present

## 2015-09-15 DIAGNOSIS — I4891 Unspecified atrial fibrillation: Principal | ICD-10-CM | POA: Diagnosis present

## 2015-09-15 DIAGNOSIS — M1990 Unspecified osteoarthritis, unspecified site: Secondary | ICD-10-CM | POA: Diagnosis present

## 2015-09-15 DIAGNOSIS — Z801 Family history of malignant neoplasm of trachea, bronchus and lung: Secondary | ICD-10-CM

## 2015-09-15 DIAGNOSIS — Z79899 Other long term (current) drug therapy: Secondary | ICD-10-CM

## 2015-09-15 DIAGNOSIS — Z7982 Long term (current) use of aspirin: Secondary | ICD-10-CM

## 2015-09-15 DIAGNOSIS — R51 Headache: Secondary | ICD-10-CM

## 2015-09-15 DIAGNOSIS — M542 Cervicalgia: Secondary | ICD-10-CM | POA: Diagnosis not present

## 2015-09-15 DIAGNOSIS — G309 Alzheimer's disease, unspecified: Secondary | ICD-10-CM | POA: Diagnosis present

## 2015-09-15 DIAGNOSIS — G308 Other Alzheimer's disease: Secondary | ICD-10-CM | POA: Diagnosis not present

## 2015-09-15 DIAGNOSIS — F0281 Dementia in other diseases classified elsewhere with behavioral disturbance: Secondary | ICD-10-CM | POA: Diagnosis not present

## 2015-09-15 LAB — URINALYSIS, ROUTINE W REFLEX MICROSCOPIC
Bilirubin Urine: NEGATIVE
GLUCOSE, UA: NEGATIVE mg/dL
HGB URINE DIPSTICK: NEGATIVE
KETONES UR: NEGATIVE mg/dL
Nitrite: POSITIVE — AB
PROTEIN: NEGATIVE mg/dL
Specific Gravity, Urine: 1.011 (ref 1.005–1.030)
pH: 6 (ref 5.0–8.0)

## 2015-09-15 LAB — CBC WITH DIFFERENTIAL/PLATELET
Basophils Absolute: 0 10*3/uL (ref 0.0–0.1)
Basophils Relative: 0 %
Eosinophils Absolute: 0.1 10*3/uL (ref 0.0–0.7)
Eosinophils Relative: 1 %
HEMATOCRIT: 36.1 % (ref 36.0–46.0)
HEMOGLOBIN: 11.8 g/dL — AB (ref 12.0–15.0)
Lymphocytes Relative: 16 %
Lymphs Abs: 1.8 10*3/uL (ref 0.7–4.0)
MCH: 31 pg (ref 26.0–34.0)
MCHC: 32.7 g/dL (ref 30.0–36.0)
MCV: 94.8 fL (ref 78.0–100.0)
MONO ABS: 0.9 10*3/uL (ref 0.1–1.0)
MONOS PCT: 8 %
NEUTROS ABS: 8.4 10*3/uL — AB (ref 1.7–7.7)
Neutrophils Relative %: 75 %
Platelets: 360 10*3/uL (ref 150–400)
RBC: 3.81 MIL/uL — ABNORMAL LOW (ref 3.87–5.11)
RDW: 13.2 % (ref 11.5–15.5)
WBC: 11.2 10*3/uL — ABNORMAL HIGH (ref 4.0–10.5)

## 2015-09-15 LAB — URINE MICROSCOPIC-ADD ON: RBC / HPF: NONE SEEN RBC/hpf (ref 0–5)

## 2015-09-15 LAB — COMPREHENSIVE METABOLIC PANEL
ALBUMIN: 3.7 g/dL (ref 3.5–5.0)
ALK PHOS: 91 U/L (ref 38–126)
ALT: 16 U/L (ref 14–54)
ANION GAP: 11 (ref 5–15)
AST: 23 U/L (ref 15–41)
BILIRUBIN TOTAL: 0.4 mg/dL (ref 0.3–1.2)
BUN: 33 mg/dL — AB (ref 6–20)
CALCIUM: 9.4 mg/dL (ref 8.9–10.3)
CO2: 27 mmol/L (ref 22–32)
Chloride: 102 mmol/L (ref 101–111)
Creatinine, Ser: 1.06 mg/dL — ABNORMAL HIGH (ref 0.44–1.00)
GFR calc Af Amer: 51 mL/min — ABNORMAL LOW (ref >60)
GFR, EST NON AFRICAN AMERICAN: 44 mL/min — AB (ref >60)
GLUCOSE: 114 mg/dL — AB (ref 65–99)
POTASSIUM: 4.2 mmol/L (ref 3.5–5.1)
Sodium: 140 mmol/L (ref 135–145)
TOTAL PROTEIN: 7.3 g/dL (ref 6.5–8.1)

## 2015-09-15 LAB — MAGNESIUM: Magnesium: 2.2 mg/dL (ref 1.7–2.4)

## 2015-09-15 LAB — PHOSPHORUS: Phosphorus: 3.4 mg/dL (ref 2.5–4.6)

## 2015-09-15 MED ORDER — MAGNESIUM SULFATE 2 GM/50ML IV SOLN
2.0000 g | Freq: Once | INTRAVENOUS | Status: AC
  Administered 2015-09-15: 2 g via INTRAVENOUS
  Filled 2015-09-15: qty 50

## 2015-09-15 MED ORDER — DILTIAZEM HCL 25 MG/5ML IV SOLN
10.0000 mg | Freq: Once | INTRAVENOUS | Status: AC
  Administered 2015-09-15: 10 mg via INTRAVENOUS
  Filled 2015-09-15: qty 5

## 2015-09-15 MED ORDER — DEXTROSE 5 % IV SOLN
1.0000 g | Freq: Once | INTRAVENOUS | Status: AC
  Administered 2015-09-15: 1 g via INTRAVENOUS
  Filled 2015-09-15: qty 10

## 2015-09-15 MED ORDER — DEXTROSE 5 % IV SOLN
5.0000 mg/h | Freq: Once | INTRAVENOUS | Status: AC
  Administered 2015-09-15: 7.5 mg/h via INTRAVENOUS
  Filled 2015-09-15: qty 100

## 2015-09-15 MED ORDER — DILTIAZEM LOAD VIA INFUSION
15.0000 mg | Freq: Once | INTRAVENOUS | Status: AC
  Administered 2015-09-15: 15 mg via INTRAVENOUS
  Filled 2015-09-15: qty 15

## 2015-09-15 MED ORDER — ASPIRIN 81 MG PO CHEW
81.0000 mg | CHEWABLE_TABLET | Freq: Once | ORAL | Status: AC
  Administered 2015-09-15: 81 mg via ORAL
  Filled 2015-09-15: qty 1

## 2015-09-15 MED ORDER — DILTIAZEM HCL 25 MG/5ML IV SOLN: 15.0000 mg | Freq: Once | INTRAVENOUS | Status: DC

## 2015-09-15 MED ORDER — SODIUM CHLORIDE 0.9 % IV BOLUS (SEPSIS)
1000.0000 mL | Freq: Once | INTRAVENOUS | Status: AC
  Administered 2015-09-15: 1000 mL via INTRAVENOUS

## 2015-09-15 NOTE — ED Notes (Signed)
Pt has hx of dementia. Oriented per norm. Pt has been complaining of midline neck pain for the past 3 days. It is unknown if pt had a fall that caused neck pain. Pt had denied numbness/tingling. Family reports pt has not been getting up as easily as normal and has had a decreased appetite.

## 2015-09-15 NOTE — ED Provider Notes (Signed)
CSN: [id number]     Arrival date & time 09/15/15  1701 [id number] MD Initiated Contact with Patient 09/15/15 1732     Chief Complaint  Patient presents with  . Neck Pain  . Tachycardia     (Consider location/radiation/quality/duration/timing/severity/associated sxs/prior Treatment) Patient is a 82 y.o. female presenting with general illness. The history is provided by a relative.  Illness Location:  Neck Quality:  Pain Severity:  Mild Onset quality:  Gradual Duration:  3 days Timing:  Constant Chronicity:  New Context:  Unknown, has dementia Relieved by:  None tried Worsened by:  None tried Ineffective treatments:  None tried Associated symptoms: fatigue and headaches   Associated symptoms: no abdominal pain, no cough, no diarrhea, no fever, no loss of consciousness, no myalgias, no nausea, no rash and no vomiting     Past Medical History  Diagnosis Date  . Dementia   . Tic douloureux   . Arthritis   . Tic douloureux   . Alzheimer disease    Past Surgical History  Procedure Laterality Date  . Tumors removal benign    . Back surgery    . Bladder tact    . Abdominal hysterectomy    . Hip pinning,cannulated Right 01/05/2013    Procedure: CANNULATED HIP PINNING RIGHT HIP;  Surgeon: Venita Lick, MD;  Location: [facility] OR;  Service: Orthopedics;  Laterality: Right;   Family History  Problem Relation Age of Onset  . Lung cancer Mother    Social History  Substance Use Topics  . Smoking status: Never Smoker   . Smokeless tobacco: Never Used  . Alcohol Use: No   OB History    No data available     Review of Systems  Unable to perform ROS: Dementia  Constitutional: Positive for fatigue. Negative for fever and chills.  Respiratory: Negative for cough.   Gastrointestinal: Negative for nausea, vomiting, abdominal pain, diarrhea and constipation.  Musculoskeletal: Positive for neck pain. Negative for myalgias, back pain and joint swelling.  Skin: Negative for rash.   Neurological: Positive for headaches. Negative for loss of consciousness.  All other systems reviewed and are negative.     Allergies  Other  Home Medications   Prior to Admission medications   Medication Sig Start Date End Date Taking? Authorizing Provider  aspirin EC 325 MG tablet Take 1 tablet (325 mg total) by mouth daily. 01/07/13  Yes Venita Lick, MD  carbamazepine (TEGRETOL) 100 MG chewable tablet Chew 1 tablet (100 mg total) by mouth 4 (four) times daily as needed (pain). 02/13/15  Yes Levert Feinstein, MD  donepezil (ARICEPT) 10 MG tablet TAKE 1 TABLET EVERY MORNING 11/09/14  Yes Levert Feinstein, MD  Liniments [facility]) PADS Apply 1 each topically daily as needed (pain).   Yes Historical Provider, MD  memantine (NAMENDA) 10 MG tablet TAKE 1 TABLET TWICE A DAY 11/09/14  Yes Levert Feinstein, MD  Multiple Vitamins-Minerals (MULTIVITAMIN WITH MINERALS) tablet Take 1 tablet by mouth daily.   Yes Historical Provider, MD   BP 131/56 mmHg  Pulse 110  Temp(Src) 98.5 F (36.9 C) (Oral)  Resp 19  SpO2 94% Physical Exam  Constitutional: She is oriented to person, place, and time. She appears well-developed and well-nourished.  HENT:  Head: Normocephalic and atraumatic.  Eyes: Pupils are equal, round, and reactive to light.  Neck: Normal range of motion.  Cardiovascular: Regular rhythm.  Tachycardia present.   Pulmonary/Chest: No stridor. No tachypnea. No respiratory distress. She has rales.  Abdominal:  Soft. Bowel sounds are normal. She exhibits no distension. There is no tenderness. There is no rebound.  Musculoskeletal: Normal range of motion. She exhibits no edema or tenderness.  Neurological: She is alert and oriented to person, place, and time. No cranial nerve deficit.  Skin: Skin is warm. No erythema.  Nursing note and vitals reviewed.   ED Course  Procedures (including critical care time)  CRITICAL CARE Performed by: Marily Memos  Total critical care time: 30 minutes Critical care  time was exclusive of separately billable procedures and treating other patients. Critical care was necessary to treat or prevent imminent or life-threatening deterioration. Critical care was time spent personally by me on the following activities: development of treatment plan with patient and/or surrogate as well as nursing, discussions with consultants, evaluation of patient's response to treatment, examination of patient, obtaining history from patient or surrogate, ordering and performing treatments and interventions, ordering and review of laboratory studies, ordering and review of radiographic studies, pulse oximetry and re-evaluation of patient's condition.   Labs Review Labs Reviewed  CBC WITH DIFFERENTIAL/PLATELET - Abnormal; Notable for the following:    WBC 11.2 (*)    RBC 3.81 (*)    Hemoglobin 11.8 (*)    Neutro Abs 8.4 (*)    All other components within normal limits  COMPREHENSIVE METABOLIC PANEL - Abnormal; Notable for the following:    Glucose, Bld 114 (*)    BUN 33 (*)    Creatinine, Ser 1.06 (*)    GFR calc non Af Amer 44 (*)    GFR calc Af Amer 51 (*)    All other components within normal limits  URINALYSIS, ROUTINE W REFLEX MICROSCOPIC (NOT AT [facility]) - Abnormal; Notable for the following:    Nitrite POSITIVE (*)    Leukocytes, UA TRACE (*)    All other components within normal limits  URINE MICROSCOPIC-ADD ON - Abnormal; Notable for the following:    Squamous Epithelial / LPF 0-5 (*)    Bacteria, UA MANY (*)    All other components within normal limits  URINE CULTURE  MAGNESIUM  PHOSPHORUS    Imaging Review Dg Chest 1 View  09/15/2015  CLINICAL DATA:  Neck pain EXAM: CHEST 1 VIEW COMPARISON:  07/11/2013 FINDINGS: Cardiac shadow is within normal limits. The lungs are well aerated bilaterally. Some chronic changes in the medial left lower lobe are seen and stable. No acute infiltrate is noted. No acute bony abnormality is seen. IMPRESSION: No acute abnormality  noted. Electronically Signed   By: Alcide Clever M.D.   On: 09/15/2015 18:10   Ct Head Wo Contrast  09/15/2015  CLINICAL DATA:  Midline neck pain for 3 days. It is unknown whether the patient had trauma. EXAM: CT HEAD WITHOUT CONTRAST CT CERVICAL SPINE WITHOUT CONTRAST TECHNIQUE: Multidetector CT imaging of the head and cervical spine was performed following the standard protocol without intravenous contrast. Multiplanar CT image reconstructions of the cervical spine were also generated. COMPARISON:  January 04, 2013 FINDINGS: CT HEAD FINDINGS There is mild debris in the left sphenoid sinus. Paranasal sinuses are otherwise within normal limits as are the mastoid air cells and middle ears. No fractures or bony lesions. Extracranial soft tissues are normal. No subdural, epidural, or subarachnoid hemorrhage. Mild prominence of the ventricles and sulci is stable. The cerebellum, brainstem, and basal cisterns are patent. No mass, mass effect, or midline shift. White matter changes again identified. No acute cortical ischemia or infarct. CT CERVICAL SPINE FINDINGS The lung  apices are normal. Soft tissues of the neck are otherwise grossly unremarkable. There is mild straightening of normal lordosis with minimal reversal at C5. No other malalignment identified. No fractures identified. There are multilevel degenerative changes. Neural foraminal narrowing at multiple levels, most marked in on the right at C4-5 and on the left at C3-4. IMPRESSION: No acute intracranial process. No traumatic malalignment or fracture in the cervical spine. Electronically Signed   By: Gerome Sam III M.D   On: 09/15/2015 18:31   Ct Cervical Spine Wo Contrast  09/15/2015  CLINICAL DATA:  Midline neck pain for 3 days. It is unknown whether the patient had trauma. EXAM: CT HEAD WITHOUT CONTRAST CT CERVICAL SPINE WITHOUT CONTRAST TECHNIQUE: Multidetector CT imaging of the head and cervical spine was performed following the standard protocol  without intravenous contrast. Multiplanar CT image reconstructions of the cervical spine were also generated. COMPARISON:  January 04, 2013 FINDINGS: CT HEAD FINDINGS There is mild debris in the left sphenoid sinus. Paranasal sinuses are otherwise within normal limits as are the mastoid air cells and middle ears. No fractures or bony lesions. Extracranial soft tissues are normal. No subdural, epidural, or subarachnoid hemorrhage. Mild prominence of the ventricles and sulci is stable. The cerebellum, brainstem, and basal cisterns are patent. No mass, mass effect, or midline shift. White matter changes again identified. No acute cortical ischemia or infarct. CT CERVICAL SPINE FINDINGS The lung apices are normal. Soft tissues of the neck are otherwise grossly unremarkable. There is mild straightening of normal lordosis with minimal reversal at C5. No other malalignment identified. No fractures identified. There are multilevel degenerative changes. Neural foraminal narrowing at multiple levels, most marked in on the right at C4-5 and on the left at C3-4. IMPRESSION: No acute intracranial process. No traumatic malalignment or fracture in the cervical spine. Electronically Signed   By: Gerome Sam III M.D   On: 09/15/2015 18:31   I have personally reviewed and evaluated these images and lab results as part of my medical decision-making.   EKG Interpretation   Date/Time:  Saturday September 15 2015 17:30:16 EST Ventricular Rate:  151 PR Interval:    QRS Duration: 100 QT Interval:  267 QTC Calculation: 423 R Axis:   59 Text Interpretation:  Sinus tachycardia Repolarization abnormality, prob  rate related Baseline wander in lead(s) V5 Confirmed by [facility] MD, Barbara Cower  [phone]) on 09/15/2015 5:42:49 PM      MDM   Final diagnoses:  Atrial fibrillation with rapid ventricular response (HCC)  UTI (lower urinary tract infection)  Nonintractable headache, unspecified chronicity pattern, unspecified headache type   Neck pain    82 yo F w/ afib w/ rvr likely causing her neck pain and headache. Some crackles concerning for possible mild instability. bp ok. Initially tried a liter of fluids to see if that improved and it did not. So treated with diltiazem bolus without effect, then another bolus and started on infusion. Also given rocephin for UTI.     Marily Memos, MD 09/15/15 2300

## 2015-09-16 ENCOUNTER — Encounter (HOSPITAL_COMMUNITY): Payer: Self-pay | Admitting: *Deleted

## 2015-09-16 ENCOUNTER — Inpatient Hospital Stay (HOSPITAL_COMMUNITY): Payer: Medicare Other

## 2015-09-16 DIAGNOSIS — N390 Urinary tract infection, site not specified: Secondary | ICD-10-CM | POA: Diagnosis present

## 2015-09-16 DIAGNOSIS — I4891 Unspecified atrial fibrillation: Principal | ICD-10-CM

## 2015-09-16 LAB — COMPREHENSIVE METABOLIC PANEL
ALT: 13 U/L — ABNORMAL LOW (ref 14–54)
AST: 18 U/L (ref 15–41)
Albumin: 3 g/dL — ABNORMAL LOW (ref 3.5–5.0)
Alkaline Phosphatase: 75 U/L (ref 38–126)
Anion gap: 10 (ref 5–15)
BILIRUBIN TOTAL: 0.6 mg/dL (ref 0.3–1.2)
BUN: 27 mg/dL — AB (ref 6–20)
CO2: 24 mmol/L (ref 22–32)
Calcium: 8.4 mg/dL — ABNORMAL LOW (ref 8.9–10.3)
Chloride: 107 mmol/L (ref 101–111)
Creatinine, Ser: 1.04 mg/dL — ABNORMAL HIGH (ref 0.44–1.00)
GFR, EST AFRICAN AMERICAN: 52 mL/min — AB (ref >60)
GFR, EST NON AFRICAN AMERICAN: 45 mL/min — AB (ref >60)
Glucose, Bld: 107 mg/dL — ABNORMAL HIGH (ref 65–99)
POTASSIUM: 4.1 mmol/L (ref 3.5–5.1)
Sodium: 141 mmol/L (ref 135–145)
TOTAL PROTEIN: 5.8 g/dL — AB (ref 6.5–8.1)

## 2015-09-16 LAB — CBC WITH DIFFERENTIAL/PLATELET
BASOS PCT: 1 %
Basophils Absolute: 0 10*3/uL (ref 0.0–0.1)
EOS ABS: 0 10*3/uL (ref 0.0–0.7)
EOS PCT: 1 %
HCT: 31.7 % — ABNORMAL LOW (ref 36.0–46.0)
HEMOGLOBIN: 10.3 g/dL — AB (ref 12.0–15.0)
Lymphocytes Relative: 15 %
Lymphs Abs: 1.3 10*3/uL (ref 0.7–4.0)
MCH: 31.5 pg (ref 26.0–34.0)
MCHC: 32.5 g/dL (ref 30.0–36.0)
MCV: 96.9 fL (ref 78.0–100.0)
Monocytes Absolute: 0.6 10*3/uL (ref 0.1–1.0)
Monocytes Relative: 7 %
NEUTROS PCT: 76 %
Neutro Abs: 6.5 10*3/uL (ref 1.7–7.7)
PLATELETS: 324 10*3/uL (ref 150–400)
RBC: 3.27 MIL/uL — AB (ref 3.87–5.11)
RDW: 13.4 % (ref 11.5–15.5)
WBC: 8.4 10*3/uL (ref 4.0–10.5)

## 2015-09-16 LAB — MRSA PCR SCREENING: MRSA BY PCR: NEGATIVE

## 2015-09-16 MED ORDER — ASPIRIN EC 325 MG PO TBEC
325.0000 mg | DELAYED_RELEASE_TABLET | Freq: Every day | ORAL | Status: DC
  Administered 2015-09-16 – 2015-09-18 (×3): 325 mg via ORAL
  Filled 2015-09-16 (×3): qty 1

## 2015-09-16 MED ORDER — PANTOPRAZOLE SODIUM 40 MG PO TBEC
40.0000 mg | DELAYED_RELEASE_TABLET | Freq: Every day | ORAL | Status: DC
  Administered 2015-09-16 – 2015-09-18 (×3): 40 mg via ORAL
  Filled 2015-09-16 (×4): qty 1

## 2015-09-16 MED ORDER — DEXTROSE 5 % IV SOLN
1.0000 g | INTRAVENOUS | Status: DC
  Administered 2015-09-16 – 2015-09-17 (×2): 1 g via INTRAVENOUS
  Filled 2015-09-16 (×3): qty 10

## 2015-09-16 MED ORDER — METOPROLOL TARTRATE 1 MG/ML IV SOLN
5.0000 mg | Freq: Once | INTRAVENOUS | Status: AC
  Administered 2015-09-16: 5 mg via INTRAVENOUS
  Filled 2015-09-16: qty 5

## 2015-09-16 MED ORDER — POTASSIUM CHLORIDE IN NACL 20-0.9 MEQ/L-% IV SOLN
INTRAVENOUS | Status: DC
  Administered 2015-09-16: 02:00:00 via INTRAVENOUS
  Filled 2015-09-16: qty 1000

## 2015-09-16 MED ORDER — MEMANTINE HCL 10 MG PO TABS
10.0000 mg | ORAL_TABLET | Freq: Two times a day (BID) | ORAL | Status: DC
  Administered 2015-09-16 – 2015-09-18 (×5): 10 mg via ORAL
  Filled 2015-09-16: qty 1
  Filled 2015-09-16: qty 2
  Filled 2015-09-16: qty 1
  Filled 2015-09-16: qty 2
  Filled 2015-09-16 (×3): qty 1

## 2015-09-16 MED ORDER — PANTOPRAZOLE SODIUM 40 MG PO TBEC
40.0000 mg | DELAYED_RELEASE_TABLET | Freq: Every day | ORAL | Status: DC
  Administered 2015-09-16: 40 mg via ORAL

## 2015-09-16 MED ORDER — ACETAMINOPHEN 325 MG PO TABS
650.0000 mg | ORAL_TABLET | Freq: Four times a day (QID) | ORAL | Status: DC | PRN
  Administered 2015-09-16 – 2015-09-18 (×3): 650 mg via ORAL
  Filled 2015-09-16 (×4): qty 2

## 2015-09-16 MED ORDER — LORAZEPAM 2 MG/ML IJ SOLN
0.5000 mg | Freq: Four times a day (QID) | INTRAMUSCULAR | Status: DC | PRN
  Administered 2015-09-16: 0.5 mg via INTRAVENOUS
  Filled 2015-09-16: qty 1

## 2015-09-16 MED ORDER — ENOXAPARIN SODIUM 40 MG/0.4ML ~~LOC~~ SOLN
40.0000 mg | SUBCUTANEOUS | Status: DC
  Administered 2015-09-16 – 2015-09-18 (×3): 40 mg via SUBCUTANEOUS
  Filled 2015-09-16 (×3): qty 0.4

## 2015-09-16 MED ORDER — SALONPAS EX PADS: 1.0000 | MEDICATED_PAD | Freq: Every day | CUTANEOUS | Status: DC | PRN

## 2015-09-16 MED ORDER — CARBAMAZEPINE 100 MG PO CHEW
100.0000 mg | CHEWABLE_TABLET | Freq: Four times a day (QID) | ORAL | Status: DC | PRN
  Filled 2015-09-16: qty 1

## 2015-09-16 MED ORDER — ADULT MULTIVITAMIN W/MINERALS CH
1.0000 | ORAL_TABLET | Freq: Every day | ORAL | Status: DC
  Administered 2015-09-16 – 2015-09-18 (×3): 1 via ORAL
  Filled 2015-09-16 (×4): qty 1

## 2015-09-16 MED ORDER — ONDANSETRON HCL 4 MG PO TABS: 4.0000 mg | ORAL_TABLET | Freq: Four times a day (QID) | ORAL | Status: DC | PRN

## 2015-09-16 MED ORDER — DONEPEZIL HCL 10 MG PO TABS
10.0000 mg | ORAL_TABLET | Freq: Every morning | ORAL | Status: DC
  Administered 2015-09-16 – 2015-09-18 (×3): 10 mg via ORAL
  Filled 2015-09-16: qty 2
  Filled 2015-09-16 (×2): qty 1

## 2015-09-16 MED ORDER — METOPROLOL SUCCINATE ER 25 MG PO TB24
25.0000 mg | ORAL_TABLET | Freq: Every day | ORAL | Status: DC
  Administered 2015-09-16 – 2015-09-17 (×2): 25 mg via ORAL
  Filled 2015-09-16 (×5): qty 1

## 2015-09-16 MED ORDER — KETOROLAC TROMETHAMINE 15 MG/ML IJ SOLN
15.0000 mg | Freq: Once | INTRAMUSCULAR | Status: AC
  Administered 2015-09-16: 15 mg via INTRAVENOUS
  Filled 2015-09-16: qty 1

## 2015-09-16 MED ORDER — DILTIAZEM HCL 100 MG IV SOLR
5.0000 mg/h | INTRAVENOUS | Status: DC
  Filled 2015-09-16: qty 100

## 2015-09-16 MED ORDER — ONDANSETRON HCL 4 MG/2ML IJ SOLN
4.0000 mg | Freq: Four times a day (QID) | INTRAMUSCULAR | Status: DC | PRN
  Filled 2015-09-16: qty 2

## 2015-09-16 MED ORDER — METOPROLOL TARTRATE 1 MG/ML IV SOLN
2.5000 mg | INTRAVENOUS | Status: DC | PRN
  Filled 2015-09-16: qty 5

## 2015-09-16 NOTE — Progress Notes (Signed)
Patient admitted after midnight- please see H&P- per family at bedside, has improved-- had episode of a fib but that has resolved.  Await urine culture-- continue IV abx tx to tele bed on floor  Marlin Canary DO

## 2015-09-16 NOTE — Progress Notes (Signed)
Pt alert to self, per MD Robb Matar, order given to d/c Cardizem drip @ 0050, and start IV Lopressor, Pt converted to NSR then 1 degree Heart block at 01:00am. Will continue to assess. Estill Dooms, RN 4:00 AM 09/16/2015

## 2015-09-16 NOTE — H&P (Signed)
[facility] History and Physical  Jodi Beltran:[id number] DOB: 05/25/1921 DOA: 09/15/2015  Referring physician: Harold Hedge, M.D. PCP: Garlan Fillers, MD   Chief Complaint: Neck pain  HPI: Jodi Beltran is a 82 y.o. female with below past medical history who comes from the urgent care center to the emergency department at [facility] after seeking medical attention there for her at midline neck pain for the past 3 days. There has not been any history of falls or any other trauma. No fever, no chills, but decreased appetite and increased fatigue per son.   Workup in the emergency department is significant for newly diagnosed atrial fibrillation with rapid ventricular response, mild leukocytosis and pyuria on urinalysis.   The patient was started on Cardizem continuous infusion, but this did not improve her RVR. Subsequently, metoprolol 5 mg IV twice a day were given in the stepdown unit, which converted the patient back to sinus rhythm. Metoprolol succinate 25 mg by mouth daily was started and the when necessary order for 2.5 mg of metoprolol every 4 hours assist in case the patient develops tachycardiac again.   Review of Systems:  Unable to review due to dementia.   Past Medical History  Diagnosis Date  . Dementia   . Tic douloureux   . Arthritis   . Tic douloureux   . Alzheimer disease    Past Surgical History  Procedure Laterality Date  . Tumors removal benign    . Back surgery    . Bladder tact    . Abdominal hysterectomy    . Hip pinning,cannulated Right 01/05/2013    Procedure: CANNULATED HIP PINNING RIGHT HIP;  Surgeon: Venita Lick, MD;  Location: [facility] OR;  Service: Orthopedics;  Laterality: Right;   Social History:  reports that she has never smoked. She has never used smokeless tobacco. She reports that she does not drink alcohol or use illicit drugs.  Allergies  Allergen Reactions  . Other Other (See Comments)    Maple tree pollen -  reaction unknown    Family History  Problem Relation Age of Onset  . Lung cancer Mother     Prior to Admission medications   Medication Sig Start Date End Date Taking? Authorizing Provider  aspirin EC 325 MG tablet Take 1 tablet (325 mg total) by mouth daily. 01/07/13  Yes Venita Lick, MD  carbamazepine (TEGRETOL) 100 MG chewable tablet Chew 1 tablet (100 mg total) by mouth 4 (four) times daily as needed (pain). 02/13/15  Yes Levert Feinstein, MD  donepezil (ARICEPT) 10 MG tablet TAKE 1 TABLET EVERY MORNING 11/09/14  Yes Levert Feinstein, MD  Liniments [facility]) PADS Apply 1 each topically daily as needed (pain).   Yes Historical Provider, MD  memantine (NAMENDA) 10 MG tablet TAKE 1 TABLET TWICE A DAY 11/09/14  Yes Levert Feinstein, MD  Multiple Vitamins-Minerals (MULTIVITAMIN WITH MINERALS) tablet Take 1 tablet by mouth daily.   Yes Historical Provider, MD   Physical Exam: Filed Vitals:   09/15/15 2215 09/15/15 2300 09/15/15 2359 09/16/15 0000  BP: 149/87 174/97 140/89 140/89  Pulse: 133 133  144  Temp:      TempSrc:      Resp: SpO2: 94% 93%  95%    Wt Readings from Last 3 Encounters:  09/22/14 62.596 kg (138 lb)  07/11/13 54.432 kg (120 lb)  05/09/13 58.968 kg (130 lb)    General:  Appears Mildly anxious. Eyes: PERRL, normal lids, irises &  conjunctiva ENT: grossly normal hearing, lips & tongue. Oral mucosa is mildly dry Neck: no LAD, masses or thyromegaly Cardiovascular: Irregularly irregular, no m/r/g. No LE edema. Telemetry: Atrial fibrillation, then converted to sinus rhythm after IV metoprolol. Respiratory: CTA bilaterally, no w/r/r. Normal respiratory effort. Abdomen: soft, ntnd Skin: no rash or induration seen on limited exam Musculoskeletal: grossly normal tone BUE/BLE Psychiatric: Disoriented Neurologic: Awake, alert, oriented 1, moves all extremities.           Labs on Admission:  Basic Metabolic Panel:  Recent Labs Lab 09/15/15 1755  NA 140  K 4.2  CL 102   CO2 27  GLUCOSE 114*  BUN 33*  CREATININE 1.06*  CALCIUM 9.4  MG 2.2  PHOS 3.4   Liver Function Tests:  Recent Labs Lab 09/15/15 1755  AST 23  ALT 16  ALKPHOS 91  BILITOT 0.4  PROT 7.3  ALBUMIN 3.7   CBC:  Recent Labs Lab 09/15/15 1755  WBC 11.2*  NEUTROABS 8.4*  HGB 11.8*  HCT 36.1  MCV 94.8  PLT 360    Radiological Exams on Admission: Dg Chest 1 View  09/15/2015  CLINICAL DATA:  Neck pain EXAM: CHEST 1 VIEW COMPARISON:  07/11/2013 FINDINGS: Cardiac shadow is within normal limits. The lungs are well aerated bilaterally. Some chronic changes in the medial left lower lobe are seen and stable. No acute infiltrate is noted. No acute bony abnormality is seen. IMPRESSION: No acute abnormality noted. Electronically Signed   By: Alcide Clever M.D.   On: 09/15/2015 18:10   Ct Head Wo Contrast  09/15/2015  CLINICAL DATA:  Midline neck pain for 3 days. It is unknown whether the patient had trauma. EXAM: CT HEAD WITHOUT CONTRAST CT CERVICAL SPINE WITHOUT CONTRAST TECHNIQUE: Multidetector CT imaging of the head and cervical spine was performed following the standard protocol without intravenous contrast. Multiplanar CT image reconstructions of the cervical spine were also generated. COMPARISON:  January 04, 2013 FINDINGS: CT HEAD FINDINGS There is mild debris in the left sphenoid sinus. Paranasal sinuses are otherwise within normal limits as are the mastoid air cells and middle ears. No fractures or bony lesions. Extracranial soft tissues are normal. No subdural, epidural, or subarachnoid hemorrhage. Mild prominence of the ventricles and sulci is stable. The cerebellum, brainstem, and basal cisterns are patent. No mass, mass effect, or midline shift. White matter changes again identified. No acute cortical ischemia or infarct. CT CERVICAL SPINE FINDINGS The lung apices are normal. Soft tissues of the neck are otherwise grossly unremarkable. There is mild straightening of normal lordosis with  minimal reversal at C5. No other malalignment identified. No fractures identified. There are multilevel degenerative changes. Neural foraminal narrowing at multiple levels, most marked in on the right at C4-5 and on the left at C3-4. IMPRESSION: No acute intracranial process. No traumatic malalignment or fracture in the cervical spine. Electronically Signed   By: Gerome Sam III M.D   On: 09/15/2015 18:31   Ct Cervical Spine Wo Contrast  09/15/2015  CLINICAL DATA:  Midline neck pain for 3 days. It is unknown whether the patient had trauma. EXAM: CT HEAD WITHOUT CONTRAST CT CERVICAL SPINE WITHOUT CONTRAST TECHNIQUE: Multidetector CT imaging of the head and cervical spine was performed following the standard protocol without intravenous contrast. Multiplanar CT image reconstructions of the cervical spine were also generated. COMPARISON:  January 04, 2013 FINDINGS: CT HEAD FINDINGS There is mild debris in the left sphenoid sinus. Paranasal sinuses are otherwise within normal limits  as are the mastoid air cells and middle ears. No fractures or bony lesions. Extracranial soft tissues are normal. No subdural, epidural, or subarachnoid hemorrhage. Mild prominence of the ventricles and sulci is stable. The cerebellum, brainstem, and basal cisterns are patent. No mass, mass effect, or midline shift. White matter changes again identified. No acute cortical ischemia or infarct. CT CERVICAL SPINE FINDINGS The lung apices are normal. Soft tissues of the neck are otherwise grossly unremarkable. There is mild straightening of normal lordosis with minimal reversal at C5. No other malalignment identified. No fractures identified. There are multilevel degenerative changes. Neural foraminal narrowing at multiple levels, most marked in on the right at C4-5 and on the left at C3-4. IMPRESSION: No acute intracranial process. No traumatic malalignment or fracture in the cervical spine. Electronically Signed   By: Gerome Sam III  M.D   On: 09/15/2015 18:31    EKG: Independently reviewed. Vent. rate 151 BPM PR interval * ms QRS duration 100 ms QT/QTc 267/423 ms P-R-T axes -1 59 68 Sinus tachycardia Repolarization abnormality, prob rate related Baseline wander in lead(s) V5  Assessment/Plan Principal Problem:   Atrial fibrillation with tachycardic ventricular rate (HCC) Admit to a stepdown. Cardizem infusion was discontinued since it did not decrease her heart rate. The patient responded to metoprolol 5 mg IVP 1. Metoprolol succinate 25 mg orally was ordered. Metoprolol 2.5 mg every 4 hours when necessary for heart rate over 10 5 bpm ordered. Check echocardiogram. Consult cardiology in a.m.  Active Problems:   UTI (urinary tract infection) Continue Rocephin 1 g every 24 hours. Follow-up urine culture and sensitivity    Dehydration Received an MS bolus in the emergency department. Continue gentle IV fluids for 20 hours.    Alzheimer's disease Continue Aricept and Namenda. Lorazepam 0.5 mg 1.    Hypertension Continue metoprolol extended release 25 mg by mouth daily. Monitor blood pressure.     Tic douloureux Continue carbamazepine as needed.   Code Status: DO NOT RESUSCITATE. DVT Prophylaxis: Lovenox SQ. Family Communication: Her son [name] was present in the room. Disposition Plan: Admit to a stepdown for rate control of atrial fibrillation.  Time spent: Over 70 minutes were spent during the process of this admission.  Bobette Mo, MD [facility] Pager [phone].

## 2015-09-17 DIAGNOSIS — N390 Urinary tract infection, site not specified: Secondary | ICD-10-CM

## 2015-09-17 DIAGNOSIS — E860 Dehydration: Secondary | ICD-10-CM

## 2015-09-17 DIAGNOSIS — G308 Other Alzheimer's disease: Secondary | ICD-10-CM

## 2015-09-17 DIAGNOSIS — F0281 Dementia in other diseases classified elsewhere with behavioral disturbance: Secondary | ICD-10-CM

## 2015-09-17 LAB — COMPREHENSIVE METABOLIC PANEL
ALT: 16 U/L (ref 14–54)
AST: 25 U/L (ref 15–41)
Albumin: 3.6 g/dL (ref 3.5–5.0)
Alkaline Phosphatase: 81 U/L (ref 38–126)
Anion gap: 12 (ref 5–15)
BILIRUBIN TOTAL: 0.3 mg/dL (ref 0.3–1.2)
BUN: 37 mg/dL — AB (ref 6–20)
CALCIUM: 8.9 mg/dL (ref 8.9–10.3)
CO2: 23 mmol/L (ref 22–32)
CREATININE: 0.84 mg/dL (ref 0.44–1.00)
Chloride: 108 mmol/L (ref 101–111)
GFR, EST NON AFRICAN AMERICAN: 59 mL/min — AB (ref >60)
Glucose, Bld: 131 mg/dL — ABNORMAL HIGH (ref 65–99)
Potassium: 4.1 mmol/L (ref 3.5–5.1)
Sodium: 143 mmol/L (ref 135–145)
TOTAL PROTEIN: 6.9 g/dL (ref 6.5–8.1)

## 2015-09-17 LAB — CBC WITH DIFFERENTIAL/PLATELET
BASOS ABS: 0.1 10*3/uL (ref 0.0–0.1)
Basophils Relative: 1 %
EOS PCT: 2 %
Eosinophils Absolute: 0.2 10*3/uL (ref 0.0–0.7)
HEMATOCRIT: 33.3 % — AB (ref 36.0–46.0)
Hemoglobin: 10.7 g/dL — ABNORMAL LOW (ref 12.0–15.0)
LYMPHS ABS: 1.8 10*3/uL (ref 0.7–4.0)
LYMPHS PCT: 23 %
MCH: 30.9 pg (ref 26.0–34.0)
MCHC: 32.1 g/dL (ref 30.0–36.0)
MCV: 96.2 fL (ref 78.0–100.0)
MONO ABS: 0.5 10*3/uL (ref 0.1–1.0)
Monocytes Relative: 6 %
NEUTROS ABS: 5.3 10*3/uL (ref 1.7–7.7)
Neutrophils Relative %: 68 %
PLATELETS: 366 10*3/uL (ref 150–400)
RBC: 3.46 MIL/uL — AB (ref 3.87–5.11)
RDW: 13.3 % (ref 11.5–15.5)
WBC: 7.8 10*3/uL (ref 4.0–10.5)

## 2015-09-17 MED ORDER — LORAZEPAM 2 MG/ML IJ SOLN
0.5000 mg | Freq: Once | INTRAMUSCULAR | Status: AC
  Administered 2015-09-17: 0.5 mg via INTRAVENOUS
  Filled 2015-09-17: qty 1

## 2015-09-17 MED ORDER — QUETIAPINE 12.5 MG HALF TABLET
12.5000 mg | ORAL_TABLET | Freq: Every day | ORAL | Status: DC
  Administered 2015-09-17: 12.5 mg via ORAL
  Filled 2015-09-17 (×2): qty 1

## 2015-09-17 MED ORDER — ALPRAZOLAM 0.25 MG PO TABS
0.2500 mg | ORAL_TABLET | Freq: Once | ORAL | Status: AC
  Administered 2015-09-17: 0.25 mg via ORAL
  Filled 2015-09-17: qty 1

## 2015-09-17 MED ORDER — HYDRALAZINE HCL 20 MG/ML IJ SOLN
10.0000 mg | Freq: Four times a day (QID) | INTRAMUSCULAR | Status: DC | PRN
  Administered 2015-09-17 (×2): 10 mg via INTRAVENOUS
  Filled 2015-09-17 (×2): qty 1

## 2015-09-17 MED ORDER — HALOPERIDOL LACTATE 5 MG/ML IJ SOLN
2.0000 mg | Freq: Once | INTRAMUSCULAR | Status: AC
  Administered 2015-09-17: 2 mg via INTRAVENOUS
  Filled 2015-09-17: qty 1

## 2015-09-17 NOTE — Progress Notes (Signed)
NP on call [place] paged regarding pt getting out of bed despite attempts to reorient pt. Will continue to monitor pt and carry out plan of care. Jodi Beltran

## 2015-09-17 NOTE — Progress Notes (Signed)
Pt c/o dizziness per son.  Pt pleasantly confused w/ no c/o upon assessment.  VS checked, manual BP 190/82.  Dr Benjamine Mola aware. Orders received. Hydralazine dose given.  Will monitor.

## 2015-09-17 NOTE — Evaluation (Signed)
Physical Therapy Evaluation Patient Details Name: Jodi Beltran MRN: [id number] DOB: Nov 29, 1920 Today's Date: 09/17/2015   History of Present Illness  82 yo female admitted with A fib with RVR, neck pain, HA.  Clinical Impression  On eval, pt required Min assist for mobility-walked ~60'x2 with RW. Pt c/o some dizziness during session.Pt is easily distracted but also fairly easily redirected especially with family present. Son present during session. D/c plan is for pt to return home with 24 hour care.     Follow Up Recommendations No PT follow up;Supervision/Assistance - 24 hour (provided pt continues to mobilize at current level (per son)    Equipment Recommendations  None recommended by PT    Recommendations for Other Services       Precautions / Restrictions Precautions Precautions: Fall Restrictions Weight Bearing Restrictions: No      Mobility  Bed Mobility Overal bed mobility: Needs Assistance Bed Mobility: Supine to Sit;Sit to Supine     Supine to sit: Min assist Sit to supine: Supervision   General bed mobility comments: Assist to get to EOB  Transfers Overall transfer level: Needs assistance Equipment used: Rolling walker (2 wheeled) Transfers: Sit to/from Stand Sit to Stand: Min assist         General transfer comment: Assist to rise, stabilize, control descent. VCs safety  Ambulation/Gait Ambulation/Gait assistance: Min assist Ambulation Distance (Feet): 60 Feet (x2) Assistive device: Rolling walker (2 wheeled) Gait Pattern/deviations: Step-through pattern;Decreased stride length; Drifts left/right     General Gait Details: Assist to safely maneuver with walker. one seated rest break at pt's request.  Stairs            Wheelchair Mobility    Modified Rankin (Stroke Patients Only)       Balance Overall balance assessment: Needs assistance         Standing balance support: During functional activity Standing balance-Leahy Scale:  Fair                               Pertinent Vitals/Pain Pain Assessment: No/denies pain    Home Living Family/patient expects to be discharged to:: Private residence Living Arrangements: Alone Available Help at Discharge: Available 24 hours/day (caregiver during day; stays with son at night) Type of Home: House Home Access: Stairs to enter Entrance Stairs-Rails: Right Entrance Stairs-Number of Steps: 2 Home Layout: One level        Prior Function Level of Independence: Needs assistance   Gait / Transfers Assistance Needed: walks with walker  ADL's / Homemaking Assistance Needed: assist with bathing, dressing        Hand Dominance        Extremity/Trunk Assessment   Upper Extremity Assessment: Overall WFL for tasks assessed           Lower Extremity Assessment: Generalized weakness      Cervical / Trunk Assessment: Normal  Communication   Communication: HOH  Cognition Arousal/Alertness: Awake/alert Behavior During Therapy: Impulsive Overall Cognitive Status: History of cognitive impairments - at baseline (however pt has been hallucinating during hospital stay per family)                      General Comments      Exercises        Assessment/Plan    PT Assessment Patient needs continued PT services  PT Diagnosis Difficulty walking;Generalized weakness;Altered mental status   PT Problem List Decreased safety awareness;Decreased  mobility;Decreased cognition  PT Treatment Interventions DME instruction;Gait training;Functional mobility training;Therapeutic activities;Patient/family education;Balance training;Therapeutic exercise   PT Goals (Current goals can be found in the Care Plan section) Acute Rehab PT Goals Patient Stated Goal: to return home per son. PT Goal Formulation: With family Time For Goal Achievement: 10/01/15 Potential to Achieve Goals: Fair    Frequency Min 3X/week   Barriers to discharge         Co-evaluation               End of Session Equipment Utilized During Treatment: Gait belt Activity Tolerance: Patient tolerated treatment well Patient left: in bed;with call bell/phone within reach;with bed alarm set;with family/visitor present           Time: 7829-5621 PT Time Calculation (min) (ACUTE ONLY): 15 min   Charges:   PT Evaluation $PT Eval Low Complexity: 1 Procedure     PT G Codes:        Rebeca Alert, MPT Pager: [phone]

## 2015-09-17 NOTE — Progress Notes (Signed)
PROGRESS NOTE  Jodi Beltran:[id number] DOB: 20-May-1921 DOA: 09/15/2015 PCP: Garlan Fillers, MD  Assessment/Plan: Atrial fibrillation with tachycardic ventricular rate (HCC) -resolved with cardizem Echocardiogram done   UTI (urinary tract infection) Continue Rocephin 1 g every 24 hours. Culture shows > 100,000 ecoli   Dehydration -s/p IVF -encourage PO intake   Alzheimer's disease Continue Aricept and Namenda. -having some delirium which is distressing to family-- will use Seroquel at night -ativan does not seem to help   Hypertension Continue metoprolol extended release 25 mg by mouth daily. Monitor blood pressure.   Tic douloureux Continue carbamazepine as needed.  Neck pain -resolved -CT scan negative   Code Status: DNR Family Communication: son at bedside Disposition Plan:    Consultants:    Procedures:      HPI/Subjective: Confused but easily redirectable Has no complaints   Objective: Filed Vitals:   09/17/15 0500 09/17/15 1311  BP: 142/68 190/82  Pulse: 82 89  Temp: 98 F (36.7 C)   Resp: 18     Intake/Output Summary (Last 24 hours) at 09/17/15 1511 Last data filed at 09/17/15 1310  Gross per 24 hour  Intake    410 ml  Output      0 ml  Net    410 ml   Filed Weights   09/16/15 0300  Weight: 63.3 kg (139 lb 8.8 oz)    Exam:   General:  Awake, confused  Cardiovascular: rrr  Respiratory: clear  Abdomen: +BS, soft  Musculoskeletal: no edema  Data Reviewed: Basic Metabolic Panel:  Recent Labs Lab 09/15/15 1755 09/16/15 0338 09/17/15 0441  NA 140 141 143  K 4.2 4.1 4.1  CL 102 107 108  CO2 GLUCOSE 114* 107* 131*  BUN 33* 27* 37*  CREATININE 1.06* 1.04* 0.84  CALCIUM 9.4 8.4* 8.9  MG 2.2  --   --   PHOS 3.4  --   --    Liver Function Tests:  Recent Labs Lab 09/15/15 1755 09/16/15 0338 09/17/15 0441  AST ALT 16 13* 16  ALKPHOS 91 75 81  BILITOT 0.4 0.6 0.3  PROT 7.3  5.8* 6.9  ALBUMIN 3.7 3.0* 3.6   No results for input(s): LIPASE, AMYLASE in the last 168 hours. No results for input(s): AMMONIA in the last 168 hours. CBC:  Recent Labs Lab 09/15/15 1755 09/16/15 0338 09/17/15 0441  WBC 11.2* 8.4 7.8  NEUTROABS 8.4* 6.5 5.3  HGB 11.8* 10.3* 10.7*  HCT 36.1 31.7* 33.3*  MCV 94.8 96.9 96.2  PLT 360 324 366   Cardiac Enzymes: No results for input(s): CKTOTAL, CKMB, CKMBINDEX, TROPONINI in the last 168 hours. BNP (last 3 results) No results for input(s): BNP in the last 8760 hours.  ProBNP (last 3 results) No results for input(s): PROBNP in the last 8760 hours.  CBG: No results for input(s): GLUCAP in the last 168 hours.  Recent Results (from the past 240 hour(s))  Urine culture     Status: None (Preliminary result)   Collection Time: 09/15/15  6:13 PM  Result Value Ref Range Status   Specimen Description URINE, RANDOM  Final   Special Requests NONE  Final   Culture   Final    >=100,000 COLONIES/mL ESCHERICHIA COLI Performed at [facility]    Report Status PENDING  Incomplete  MRSA PCR Screening     Status: None   Collection Time: 09/16/15 12:15 AM  Result Value Ref Range Status  MRSA by PCR NEGATIVE NEGATIVE Final    Comment:        The GeneXpert MRSA Assay (FDA approved for NASAL specimens only), is one component of a comprehensive MRSA colonization surveillance program. It is not intended to diagnose MRSA infection nor to guide or monitor treatment for MRSA infections.      Studies: Dg Chest 1 View  09/15/2015  CLINICAL DATA:  Neck pain EXAM: CHEST 1 VIEW COMPARISON:  07/11/2013 FINDINGS: Cardiac shadow is within normal limits. The lungs are well aerated bilaterally. Some chronic changes in the medial left lower lobe are seen and stable. No acute infiltrate is noted. No acute bony abnormality is seen. IMPRESSION: No acute abnormality noted. Electronically Signed   By: Alcide Clever M.D.   On: 09/15/2015 18:10    Ct Head Wo Contrast  09/15/2015  CLINICAL DATA:  Midline neck pain for 3 days. It is unknown whether the patient had trauma. EXAM: CT HEAD WITHOUT CONTRAST CT CERVICAL SPINE WITHOUT CONTRAST TECHNIQUE: Multidetector CT imaging of the head and cervical spine was performed following the standard protocol without intravenous contrast. Multiplanar CT image reconstructions of the cervical spine were also generated. COMPARISON:  January 04, 2013 FINDINGS: CT HEAD FINDINGS There is mild debris in the left sphenoid sinus. Paranasal sinuses are otherwise within normal limits as are the mastoid air cells and middle ears. No fractures or bony lesions. Extracranial soft tissues are normal. No subdural, epidural, or subarachnoid hemorrhage. Mild prominence of the ventricles and sulci is stable. The cerebellum, brainstem, and basal cisterns are patent. No mass, mass effect, or midline shift. White matter changes again identified. No acute cortical ischemia or infarct. CT CERVICAL SPINE FINDINGS The lung apices are normal. Soft tissues of the neck are otherwise grossly unremarkable. There is mild straightening of normal lordosis with minimal reversal at C5. No other malalignment identified. No fractures identified. There are multilevel degenerative changes. Neural foraminal narrowing at multiple levels, most marked in on the right at C4-5 and on the left at C3-4. IMPRESSION: No acute intracranial process. No traumatic malalignment or fracture in the cervical spine. Electronically Signed   By: Gerome Sam III M.D   On: 09/15/2015 18:31   Ct Cervical Spine Wo Contrast  09/15/2015  CLINICAL DATA:  Midline neck pain for 3 days. It is unknown whether the patient had trauma. EXAM: CT HEAD WITHOUT CONTRAST CT CERVICAL SPINE WITHOUT CONTRAST TECHNIQUE: Multidetector CT imaging of the head and cervical spine was performed following the standard protocol without intravenous contrast. Multiplanar CT image reconstructions of the  cervical spine were also generated. COMPARISON:  January 04, 2013 FINDINGS: CT HEAD FINDINGS There is mild debris in the left sphenoid sinus. Paranasal sinuses are otherwise within normal limits as are the mastoid air cells and middle ears. No fractures or bony lesions. Extracranial soft tissues are normal. No subdural, epidural, or subarachnoid hemorrhage. Mild prominence of the ventricles and sulci is stable. The cerebellum, brainstem, and basal cisterns are patent. No mass, mass effect, or midline shift. White matter changes again identified. No acute cortical ischemia or infarct. CT CERVICAL SPINE FINDINGS The lung apices are normal. Soft tissues of the neck are otherwise grossly unremarkable. There is mild straightening of normal lordosis with minimal reversal at C5. No other malalignment identified. No fractures identified. There are multilevel degenerative changes. Neural foraminal narrowing at multiple levels, most marked in on the right at C4-5 and on the left at C3-4. IMPRESSION: No acute intracranial process. No traumatic  malalignment or fracture in the cervical spine. Electronically Signed   By: Gerome Sam III M.D   On: 09/15/2015 18:31    Scheduled Meds: . aspirin EC  325 mg Oral Daily  . cefTRIAXone (ROCEPHIN)  IV  1 g Intravenous Q24H  . donepezil  10 mg Oral q morning - 10a  . enoxaparin (LOVENOX) injection  40 mg Subcutaneous Q24H  . memantine  10 mg Oral BID  . metoprolol succinate  25 mg Oral Daily  . multivitamin with minerals  1 tablet Oral Daily  . pantoprazole  40 mg Oral Daily  . QUEtiapine  12.5 mg Oral QHS   Continuous Infusions:  Antibiotics Given (last 72 hours)    Date/Time Action Medication Dose Rate   09/16/15 2030 Given   cefTRIAXone (ROCEPHIN) 1 g in dextrose 5 % 50 mL IVPB 1 g 100 mL/hr      Principal Problem:   Atrial fibrillation with tachycardic ventricular rate (HCC) Active Problems:   Dehydration   Tic douloureux   Alzheimer's disease    Hypertension   UTI (urinary tract infection)    Time spent: 25 min    Frida Wahlstrom [facility]  [facility] Pager [phone]. If 7PM-7AM, please contact night-coverage at [email or web address], password [facility] 09/17/2015, 3:11 PM  LOS: 2 days

## 2015-09-18 LAB — CBC WITH DIFFERENTIAL/PLATELET
BASOS ABS: 0.1 10*3/uL (ref 0.0–0.1)
BASOS PCT: 1 %
EOS ABS: 0.1 10*3/uL (ref 0.0–0.7)
Eosinophils Relative: 1 %
HCT: 36.1 % (ref 36.0–46.0)
HEMOGLOBIN: 11.8 g/dL — AB (ref 12.0–15.0)
Lymphocytes Relative: 16 %
Lymphs Abs: 1.5 10*3/uL (ref 0.7–4.0)
MCH: 30.9 pg (ref 26.0–34.0)
MCHC: 32.7 g/dL (ref 30.0–36.0)
MCV: 94.5 fL (ref 78.0–100.0)
MONOS PCT: 8 %
Monocytes Absolute: 0.8 10*3/uL (ref 0.1–1.0)
NEUTROS PCT: 76 %
Neutro Abs: 7.5 10*3/uL (ref 1.7–7.7)
Platelets: 388 10*3/uL (ref 150–400)
RBC: 3.82 MIL/uL — ABNORMAL LOW (ref 3.87–5.11)
RDW: 13.3 % (ref 11.5–15.5)
WBC: 9.9 10*3/uL (ref 4.0–10.5)

## 2015-09-18 LAB — URINE CULTURE: Culture: >=100000

## 2015-09-18 LAB — COMPREHENSIVE METABOLIC PANEL
ALBUMIN: 3.7 g/dL (ref 3.5–5.0)
ALK PHOS: 85 U/L (ref 38–126)
ALT: 18 U/L (ref 14–54)
ANION GAP: 12 (ref 5–15)
AST: 27 U/L (ref 15–41)
BUN: 24 mg/dL — ABNORMAL HIGH (ref 6–20)
CO2: 24 mmol/L (ref 22–32)
Calcium: 9.4 mg/dL (ref 8.9–10.3)
Chloride: 106 mmol/L (ref 101–111)
Creatinine, Ser: 1 mg/dL (ref 0.44–1.00)
GFR calc Af Amer: 55 mL/min — ABNORMAL LOW (ref >60)
GFR calc non Af Amer: 47 mL/min — ABNORMAL LOW (ref >60)
GLUCOSE: 129 mg/dL — AB (ref 65–99)
POTASSIUM: 3.9 mmol/L (ref 3.5–5.1)
SODIUM: 142 mmol/L (ref 135–145)
Total Bilirubin: 0.4 mg/dL (ref 0.3–1.2)
Total Protein: 6.8 g/dL (ref 6.5–8.1)

## 2015-09-18 MED ORDER — METOPROLOL SUCCINATE ER 25 MG PO TB24: 25.0000 mg | ORAL_TABLET | Freq: Every day | ORAL | Status: AC

## 2015-09-18 MED ORDER — QUETIAPINE FUMARATE 25 MG PO TABS: 12.5000 mg | ORAL_TABLET | Freq: Every day | ORAL | Status: DC

## 2015-09-18 MED ORDER — ALPRAZOLAM 0.25 MG PO TABS: 0.2500 mg | ORAL_TABLET | Freq: Every evening | ORAL | Status: DC | PRN

## 2015-09-18 MED ORDER — HALOPERIDOL LACTATE 5 MG/ML IJ SOLN
1.0000 mg | Freq: Once | INTRAMUSCULAR | Status: AC
  Administered 2015-09-18: 1 mg via INTRAVENOUS
  Filled 2015-09-18: qty 1

## 2015-09-18 NOTE — Progress Notes (Signed)
Utilization review completed.  

## 2015-09-18 NOTE — Discharge Summary (Signed)
Physician Discharge Summary  Jodi Beltran YNW:[id number] DOB: 09-06-1920 DOA: 09/15/2015  PCP: Garlan Fillers, MD  Admit date: 09/15/2015 Discharge date: 09/18/2015  Time spent: 35 minutes  Recommendations for Outpatient Follow-up:  1. 24 hour supervision by family   Discharge Diagnoses:  Principal Problem:   Atrial fibrillation with tachycardic ventricular rate (HCC) Active Problems:   Dehydration   Tic douloureux   Alzheimer's disease   Hypertension   UTI (urinary tract infection)   Discharge Condition: improved  Diet recommendation: cardiac  Filed Weights   09/16/15 0300  Weight: 63.3 kg (139 lb 8.8 oz)    History of present illness:  Jodi Beltran is a 82 y.o. female with below past medical history who comes from the urgent care center to the emergency department at [facility] after seeking medical attention there for her at midline neck pain for the past 3 days. There has not been any history of falls or any other trauma. No fever, no chills, but decreased appetite and increased fatigue per son.   Workup in the emergency department is significant for newly diagnosed atrial fibrillation with rapid ventricular response, mild leukocytosis and pyuria on urinalysis.   The patient was started on Cardizem continuous infusion, but this did not improve her RVR. Subsequently, metoprolol 5 mg IV twice a day were given in the stepdown unit, which converted the patient back to sinus rhythm. Metoprolol succinate 25 mg by mouth daily was started and the when necessary order for 2.5 mg of metoprolol every 4 hours assist in case the patient develops tachycardiac again.  Hospital Course:  Atrial fibrillation with tachycardic ventricular rate (HCC) -resolved with cardizem Echocardiogram done -no further episodes -not a candidate for anticoagulation due to risk of falls   UTI (urinary tract infection) Treated with Rocephin 1 g every 24 hours x 3 days Culture shows >  100,000 ecoli   Dehydration -s/p IVF -encourage PO intake   Alzheimer's disease Continue Aricept and Namenda. -having some delirium which is distressing to family-- will use Seroquel at night -family asked for xanax-- discussed risks with family and need for supervision but they still requested a few doses for when she get VERY anxious and can not sleep   Hypertension Continue metoprolol extended release 25 mg by mouth daily. Monitor blood pressure.   Tic douloureux Continue carbamazepine as needed.  Neck pain -resolved -CT scan negative  Procedures:  echo  Consultations:    Discharge Exam: Filed Vitals:   09/17/15 2100 09/18/15 0923  BP: 195/61 156/97  Pulse: 97 83  Temp: 97.9 F (36.6 C) 98 F (36.7 C)  Resp: 21 20    General: did not sleep well as she was moved to a new room at 2AM Cardiovascular: rrr Respiratory: clear  Discharge Instructions   Discharge Instructions    Diet - low sodium heart healthy    Complete by:  As directed      Discharge instructions    Complete by:  As directed   24 hour supervision Cautiously use xanax-- will need more frequent monitoring as can increase chance of falling     Increase activity slowly    Complete by:  As directed           Discharge Medication List as of 09/18/2015  1:31 PM    START taking these medications   Details  ALPRAZolam (XANAX) 0.25 MG tablet Take 1 tablet (0.25 mg total) by mouth at bedtime as needed for anxiety., Starting 09/18/2015, Until  Discontinued, Print    metoprolol succinate (TOPROL-XL) 25 MG 24 hr tablet Take 1 tablet (25 mg total) by mouth daily., Starting 09/18/2015, Until Discontinued, Print    QUEtiapine (SEROQUEL) 25 MG tablet Take 0.5 tablets (12.5 mg total) by mouth at bedtime., Starting 09/18/2015, Until Discontinued, Print      CONTINUE these medications which have NOT CHANGED   Details  aspirin EC 325 MG tablet Take 1 tablet (325 mg total) by mouth daily., Starting  01/07/2013, Until Discontinued, Print    carbamazepine (TEGRETOL) 100 MG chewable tablet Chew 1 tablet (100 mg total) by mouth 4 (four) times daily as needed (pain)., Starting 02/13/2015, Until Discontinued, Normal    donepezil (ARICEPT) 10 MG tablet TAKE 1 TABLET EVERY MORNING, Normal    Liniments (SALONPAS) PADS Apply 1 each topically daily as needed (pain)., Until Discontinued, Historical Med    memantine (NAMENDA) 10 MG tablet TAKE 1 TABLET TWICE A DAY, Normal    Multiple Vitamins-Minerals (MULTIVITAMIN WITH MINERALS) tablet Take 1 tablet by mouth daily., Until Discontinued, Historical Med       Allergies  Allergen Reactions  . Other Other (See Comments)    Maple tree pollen - reaction unknown   Follow-up Information    Follow up with Garlan Fillers, MD In 1 week.   Specialty:  Internal Medicine   Contact information:   [address] [place] [address] [phone]        The results of significant diagnostics from this hospitalization (including imaging, microbiology, ancillary and laboratory) are listed below for reference.    Significant Diagnostic Studies: Dg Chest 1 View  09/15/2015  CLINICAL DATA:  Neck pain EXAM: CHEST 1 VIEW COMPARISON:  07/11/2013 FINDINGS: Cardiac shadow is within normal limits. The lungs are well aerated bilaterally. Some chronic changes in the medial left lower lobe are seen and stable. No acute infiltrate is noted. No acute bony abnormality is seen. IMPRESSION: No acute abnormality noted. Electronically Signed   By: Alcide Clever M.D.   On: 09/15/2015 18:10   Ct Head Wo Contrast  09/15/2015  CLINICAL DATA:  Midline neck pain for 3 days. It is unknown whether the patient had trauma. EXAM: CT HEAD WITHOUT CONTRAST CT CERVICAL SPINE WITHOUT CONTRAST TECHNIQUE: Multidetector CT imaging of the head and cervical spine was performed following the standard protocol without intravenous contrast. Multiplanar CT image reconstructions of the cervical  spine were also generated. COMPARISON:  January 04, 2013 FINDINGS: CT HEAD FINDINGS There is mild debris in the left sphenoid sinus. Paranasal sinuses are otherwise within normal limits as are the mastoid air cells and middle ears. No fractures or bony lesions. Extracranial soft tissues are normal. No subdural, epidural, or subarachnoid hemorrhage. Mild prominence of the ventricles and sulci is stable. The cerebellum, brainstem, and basal cisterns are patent. No mass, mass effect, or midline shift. White matter changes again identified. No acute cortical ischemia or infarct. CT CERVICAL SPINE FINDINGS The lung apices are normal. Soft tissues of the neck are otherwise grossly unremarkable. There is mild straightening of normal lordosis with minimal reversal at C5. No other malalignment identified. No fractures identified. There are multilevel degenerative changes. Neural foraminal narrowing at multiple levels, most marked in on the right at C4-5 and on the left at C3-4. IMPRESSION: No acute intracranial process. No traumatic malalignment or fracture in the cervical spine. Electronically Signed   By: Gerome Sam III M.D   On: 09/15/2015 18:31   Ct Cervical Spine Wo Contrast  09/15/2015  CLINICAL DATA:  Midline neck pain for 3 days. It is unknown whether the patient had trauma. EXAM: CT HEAD WITHOUT CONTRAST CT CERVICAL SPINE WITHOUT CONTRAST TECHNIQUE: Multidetector CT imaging of the head and cervical spine was performed following the standard protocol without intravenous contrast. Multiplanar CT image reconstructions of the cervical spine were also generated. COMPARISON:  January 04, 2013 FINDINGS: CT HEAD FINDINGS There is mild debris in the left sphenoid sinus. Paranasal sinuses are otherwise within normal limits as are the mastoid air cells and middle ears. No fractures or bony lesions. Extracranial soft tissues are normal. No subdural, epidural, or subarachnoid hemorrhage. Mild prominence of the ventricles and  sulci is stable. The cerebellum, brainstem, and basal cisterns are patent. No mass, mass effect, or midline shift. White matter changes again identified. No acute cortical ischemia or infarct. CT CERVICAL SPINE FINDINGS The lung apices are normal. Soft tissues of the neck are otherwise grossly unremarkable. There is mild straightening of normal lordosis with minimal reversal at C5. No other malalignment identified. No fractures identified. There are multilevel degenerative changes. Neural foraminal narrowing at multiple levels, most marked in on the right at C4-5 and on the left at C3-4. IMPRESSION: No acute intracranial process. No traumatic malalignment or fracture in the cervical spine. Electronically Signed   By: Gerome Sam III M.D   On: 09/15/2015 18:31    Microbiology: Recent Results (from the past 240 hour(s))  Urine culture     Status: None   Collection Time: 09/15/15  6:13 PM  Result Value Ref Range Status   Specimen Description URINE, RANDOM  Final   Special Requests NONE  Final   Culture   Final    >=100,000 COLONIES/mL ESCHERICHIA COLI Performed at [facility]    Report Status 09/18/2015 FINAL  Final   Organism ID, Bacteria ESCHERICHIA COLI  Final      Susceptibility   Escherichia coli - MIC*    AMPICILLIN 4 SENSITIVE Sensitive     CEFAZOLIN <=4 SENSITIVE Sensitive     CEFTRIAXONE <=1 SENSITIVE Sensitive     CIPROFLOXACIN >=4 RESISTANT Resistant     GENTAMICIN <=1 SENSITIVE Sensitive     IMIPENEM <=0.25 SENSITIVE Sensitive     NITROFURANTOIN <=16 SENSITIVE Sensitive     TRIMETH/SULFA <=20 SENSITIVE Sensitive     AMPICILLIN/SULBACTAM <=2 SENSITIVE Sensitive     PIP/TAZO <=4 SENSITIVE Sensitive     * >=100,000 COLONIES/mL ESCHERICHIA COLI  MRSA PCR Screening     Status: None   Collection Time: 09/16/15 12:15 AM  Result Value Ref Range Status   MRSA by PCR NEGATIVE NEGATIVE Final    Comment:        The GeneXpert MRSA Assay (FDA approved for NASAL  specimens only), is one component of a comprehensive MRSA colonization surveillance program. It is not intended to diagnose MRSA infection nor to guide or monitor treatment for MRSA infections.      Labs: Basic Metabolic Panel:  Recent Labs Lab 09/15/15 1755 09/16/15 0338 09/17/15 0441 09/18/15 0344  NA 140 141 143 142  K 4.2 4.1 4.1 3.9  CL 102 107 108 106  CO2 27 24 23 24   GLUCOSE 114* 107* 131* 129*  BUN 33* 27* 37* 24*  CREATININE 1.06* 1.04* 0.84 1.00  CALCIUM 9.4 8.4* 8.9 9.4  MG 2.2  --   --   --   PHOS 3.4  --   --   --    Liver Function Tests:  Recent Labs  Lab 09/15/15 1755 09/16/15 0338 09/17/15 0441 09/18/15 0344  AST 23 18 25 27   ALT 16 13* 16 18  ALKPHOS 91 75 81 85  BILITOT 0.4 0.6 0.3 0.4  PROT 7.3 5.8* 6.9 6.8  ALBUMIN 3.7 3.0* 3.6 3.7   No results for input(s): LIPASE, AMYLASE in the last 168 hours. No results for input(s): AMMONIA in the last 168 hours. CBC:  Recent Labs Lab 09/15/15 1755 09/16/15 0338 09/17/15 0441 09/18/15 0344  WBC 11.2* 8.4 7.8 9.9  NEUTROABS 8.4* 6.5 5.3 7.5  HGB 11.8* 10.3* 10.7* 11.8*  HCT 36.1 31.7* 33.3* 36.1  MCV 94.8 96.9 96.2 94.5  PLT 360 324 366 388   Cardiac Enzymes: No results for input(s): CKTOTAL, CKMB, CKMBINDEX, TROPONINI in the last 168 hours. BNP: BNP (last 3 results) No results for input(s): BNP in the last 8760 hours.  ProBNP (last 3 results) No results for input(s): PROBNP in the last 8760 hours.  CBG: No results for input(s): GLUCAP in the last 168 hours.     Signed:  Joseph Art DO.  [facility] 09/18/2015, 6:03 PM

## 2015-09-18 NOTE — Care Management Important Message (Signed)
Important Message  Patient Details  Name: Jodi Beltran MRN: [id number] Date of Birth: 1921-04-26   Medicare Important Message Given:  Yes    [name] 09/18/2015, 2:01 PMImportant Message  Patient Details  Name: Jodi Beltran MRN: [id number] Date of Birth: [birth year]   Medicare Important Message Given:  Yes    [name] 09/18/2015, 2:01 PM

## 2015-09-18 NOTE — Care Management Important Message (Signed)
Important Message  Patient Details  Name: Jodi Beltran MRN: [id number] Date of Birth: July 22, 1920   Medicare Important Message Given:  Yes    [name] 09/18/2015, 1:57 PMImportant Message  Patient Details  Name: Jodi Beltran MRN: [id number] Date of Birth: 20-Aug-1920   Medicare Important Message Given:  Yes    [name] 09/18/2015, 1:56 PM

## 2015-09-25 ENCOUNTER — Ambulatory Visit (INDEPENDENT_AMBULATORY_CARE_PROVIDER_SITE_OTHER): Payer: Medicare Other | Admitting: Neurology

## 2015-09-25 ENCOUNTER — Encounter: Payer: Self-pay | Admitting: Neurology

## 2015-09-25 VITALS — BP 141/76 | HR 76 | Ht 62.0 in | Wt 134.0 lb

## 2015-09-25 DIAGNOSIS — I4891 Unspecified atrial fibrillation: Secondary | ICD-10-CM

## 2015-09-25 DIAGNOSIS — G500 Trigeminal neuralgia: Secondary | ICD-10-CM | POA: Diagnosis not present

## 2015-09-25 DIAGNOSIS — F02818 Dementia in other diseases classified elsewhere, unspecified severity, with other behavioral disturbance: Secondary | ICD-10-CM

## 2015-09-25 DIAGNOSIS — G308 Other Alzheimer's disease: Secondary | ICD-10-CM | POA: Diagnosis not present

## 2015-09-25 DIAGNOSIS — F0281 Dementia in other diseases classified elsewhere with behavioral disturbance: Secondary | ICD-10-CM | POA: Diagnosis not present

## 2015-09-25 MED ORDER — CARBAMAZEPINE 100 MG PO CHEW: 100.0000 mg | CHEWABLE_TABLET | Freq: Four times a day (QID) | ORAL | Status: DC | PRN

## 2015-09-25 MED ORDER — MEMANTINE HCL 10 MG PO TABS: 10.0000 mg | ORAL_TABLET | Freq: Two times a day (BID) | ORAL | Status: DC

## 2015-09-25 MED ORDER — DONEPEZIL HCL 10 MG PO TABS: 10.0000 mg | ORAL_TABLET | Freq: Every morning | ORAL | Status: DC

## 2015-09-25 NOTE — Progress Notes (Signed)
Chief Complaint  Patient presents with  . Tic Douloureux    She is here with her son, Felicity Pellegrinied and her caregiver, Corrie DandyMary.  Her symptoms have improved and he has decreased her Tegretol 100mg  to one tablet daily.  . Memory Loss    MMSE 17/30 - 5 animals.  Feels her memory has slightly declined over the last year.  She has had a recent hospitalization due to a UTI.      [facility]  PATIENT: Jodi Beltran DOB: 09/17/1920  HISTORICAL  Jodi Beltran is a 82 years old right-handed Caucasian female, referred by her primary care physician, accompanied by her son, and caregiver at today's visit, she was patient of Dr. love, last clinical visit was in November 2013  She had a past medical history of right trigeminal neuralgia, symptoms started in 1995, first evaluated in 1996, has been taking Tegretol 100 mg daily, sometimes extra 100 mg tablet, she is doing very well, there was no recurrent right facial pain. She also has gradual onset mild to moderate memory trouble, she has CNA 7 days a week, she goes to her son's house for night time.  MRI of the brain in 2007 showed cerebral and pontine small vessel disease.  She has no recurrent left facial pain doing very well, just want to have her medicine refilled.   UPDATE March 4th 2016: Last visit was in 04/2013, she lives at home, accompanied by her son and care give at today's visit.  She is getting more forgetful, today MMSE 21/30, not oriented to time and place, missed 3/3 recalls,  She denies significant facial pain, has been on Tegretol 100 mg 1 tablets daily for many years, extra doses as needed, had a history of right hip fracture require surgical fixation,  UPDATE March 7th 2017: She has not had recurrent right trigeminal neuralgia, she is now only taking tegretol 100mg  qday,  She was noted to have right facial pain by holding her right face, grimace of her right face, usually one extra tegretol would help.  She was admitted to  the hospital February 20 fifth 220 eighth 2017 for UTI, she was noted to have new onset atrial fibrillation, was treated with metoprolol, she also had increased confusion during her hospital stay, she was treated with antibiotic Rocephin, now is back to her baseline, continue Aricept, and Namenda  Her memory overall has mild progression,  She can still carry on conversation, she can still dress herself, feed,  sleeps well, she has forceful left eye closing  REVIEW OF SYSTEMS: Full 14 system review of systems performed and notable only for incontinence of bladder, memory loss  ALLERGIES: Allergies  Allergen Reactions  . Other Other (See Comments)    Maple tree pollen - reaction unknown    HOME MEDICATIONS: Outpatient Prescriptions Prior to Visit  Medication Sig Dispense Refill  . ALPRAZolam (XANAX) 0.25 MG tablet Take 1 tablet (0.25 mg total) by mouth at bedtime as needed for anxiety. 10 tablet 0  . aspirin EC 325 MG tablet Take 1 tablet (325 mg total) by mouth daily. 30 tablet 0  . carbamazepine (TEGRETOL) 100 MG chewable tablet Chew 1 tablet (100 mg total) by mouth 4 (four) times daily as needed (pain). 360 tablet 3  . donepezil (ARICEPT) 10 MG tablet TAKE 1 TABLET EVERY MORNING 90 tablet 4  . Liniments (SALONPAS) PADS Apply 1 each topically daily as needed (pain).    . memantine (NAMENDA) 10 MG tablet TAKE 1 TABLET  TWICE A DAY 180 tablet 4  . metoprolol succinate (TOPROL-XL) 25 MG 24 hr tablet Take 1 tablet (25 mg total) by mouth daily. 30 tablet 0  . Multiple Vitamins-Minerals (MULTIVITAMIN WITH MINERALS) tablet Take 1 tablet by mouth daily.    . QUEtiapine (SEROQUEL) 25 MG tablet Take 0.5 tablets (12.5 mg total) by mouth at bedtime. 15 tablet 0   No facility-administered medications prior to visit.    PAST MEDICAL HISTORY: Past Medical History  Diagnosis Date  . Dementia   . Tic douloureux   . Arthritis   . Tic douloureux   . Alzheimer disease     PAST SURGICAL  HISTORY: Past Surgical History  Procedure Laterality Date  . Tumors removal benign    . Back surgery    . Bladder tact    . Abdominal hysterectomy    . Hip pinning,cannulated Right 01/05/2013    Procedure: CANNULATED HIP PINNING RIGHT HIP;  Surgeon: Venita Lick, MD;  Location: [facility] OR;  Service: Orthopedics;  Laterality: Right;    FAMILY HISTORY: Family History  Problem Relation Age of Onset  . Lung cancer Mother     SOCIAL HISTORY:  Social History   Social History  . Marital Status: Widowed    Spouse Name: N/A  . Number of Children: 2  . Years of Education: 9th   Occupational History  .      Retired   Social History Main Topics  . Smoking status: Never Smoker   . Smokeless tobacco: Never Used  . Alcohol Use: No  . Drug Use: No  . Sexual Activity: No   Other Topics Concern  . Not on file   Social History Narrative   Patient goes back and fourth between sons house and hers.   Retired.   Education. 9 th grade education.   Right handed.   Caffeine- two cups daily.    Patient is  a widow.    PHYSICAL EXAM   Filed Vitals:   09/25/15 1455  BP: 141/76  Pulse: 76  Height:  (1.575 m)  Weight: 134 lb (60.782 kg)    Body mass index is 24.5 kg/(m^2).  PHYSICAL EXAMNIATION:  Gen: NAD, conversant, well nourised, obese, well groomed                     Cardiovascular: Regular rate rhythm, no peripheral edema, warm, nontender. Eyes: Conjunctivae clear without exudates or hemorrhage Neck: Supple, no carotid bruise. Pulmonary: Clear to auscultation bilaterally   NEUROLOGICAL EXAM:  MENTAL STATUS: Speech:    Speech is normal; fluent and spontaneous with normal comprehension.  Cognition: MMSE 17/30, animal naming 5     Orientation to time, place and person: She is not oriented to time, date, year, day, month, season, clinic     Recent and remote memory: missed 3/3 recall     Normal Attention span and concentration     Normal Language, naming,  repeating,spontaneous speech: difficulty follow 3 steps command, difficulty copy design     Fund of knowledge     CRANIAL NERVES: CN II: Visual fields are full to confrontation. Fundoscopic exam is normal with sharp discs and no vascular changes. Venous pulsations are present bilaterally. Pupils are 4 mm and briskly reactive to light. Visual acuity is 20/20 bilaterally. CN III, IV, VI: extraocular movement are normal. No ptosis. CN V: Facial sensation is intact to pinprick in all 3 divisions bilaterally. Corneal responses are intact. She has frequent forceful left eye  closing, with intermittent left cheek muscle spasm  CN VII: Face is symmetric with normal eye closure and smile. CN VIII: Hearing is normal to rubbing fingers CN IX, X: Palate elevates symmetrically. Phonation is normal. CN XI: Head turning and shoulder shrug are intact CN XII: Tongue is midline with normal movements and no atrophy.  MOTOR: There is no pronator drift of out-stretched arms. Muscle bulk and tone are normal. Muscle strength is normal.   Shoulder abduction Shoulder external rotation Elbow flexion Elbow extension Wrist flexion Wrist extension Finger abduction Hip flexion Knee flexion Knee extension Ankle dorsi flexion Ankle plantar flexion  R L REFLEXES: Reflexes are 2+ and symmetric at the biceps, triceps, knees, and ankles. Plantar responses are flexor.  SENSORY: Light touch, pinprick, position sense, and vibration sense are intact in fingers and toes.  COORDINATION: Rapid alternating movements and fine finger movements are intact. There is no dysmetria on finger-to-nose and heel-knee-shin. There are no abnormal or extraneous movements.   GAIT/STANCE: Posture is normal. Gait is steady with normal steps, base, arm swing, and turning. Heel and toe walking are normal. Tandem gait is normal.  Romberg is absent.   DIAGNOSTIC DATA (LABS, IMAGING,  TESTING) - I reviewed patient records, labs, notes, testing and imaging myself where available.  Lab Results  Component Value Date   WBC 9.9 09/18/2015   HGB 11.8* 09/18/2015   HCT 36.1 09/18/2015   MCV 94.5 09/18/2015   PLT 388 09/18/2015      Component Value Date/Time   NA 142 09/18/2015 0344   K 3.9 09/18/2015 0344   CL 106 09/18/2015 0344   CO2 24 09/18/2015 0344   GLUCOSE 129* 09/18/2015 0344   BUN 24* 09/18/2015 0344   CREATININE 1.00 09/18/2015 0344   CALCIUM 9.4 09/18/2015 0344   PROT 6.8 09/18/2015 0344   ALBUMIN 3.7 09/18/2015 0344   AST 27 09/18/2015 0344   ALT 18 09/18/2015 0344   ALKPHOS 85 09/18/2015 0344   BILITOT 0.4 09/18/2015 0344   GFRNONAA 47* 09/18/2015 0344   GFRAA 55* 09/18/2015 0344    ASSESSMENT AND PLAN 82 years old Caucasian female, with past medical history of left trigeminal neuralgia, dementia, doing very well  Trigeminal neuralgia  doing well on Tegretol, refilled her prescription,  Previously we have tried Neurontin she could not tolerate the side effect to 100 mg 3 times a day, call office for recurrent facial pain Dementia,  Mini-Mental Status Examination is 17 out of 30 today,   keep current dose of Aricept, Namenda,  Left hemifacial spasm, forceful left eye closing  I have discussion with patient and her son about potential EMG guided botulism toxin injection, they will call back to clinic for decision   Levert Feinstein, M.D. Ph.D.  [facility] [address] [place], [place] [address] [phone]

## 2016-05-31 ENCOUNTER — Encounter: Payer: Self-pay | Admitting: Internal Medicine

## 2016-07-21 DIAGNOSIS — I4891 Unspecified atrial fibrillation: Secondary | ICD-10-CM

## 2016-07-21 DIAGNOSIS — N390 Urinary tract infection, site not specified: Secondary | ICD-10-CM

## 2016-07-21 HISTORY — DX: Urinary tract infection, site not specified: N39.0

## 2016-07-21 HISTORY — DX: Unspecified atrial fibrillation: I48.91

## 2016-08-13 ENCOUNTER — Inpatient Hospital Stay (HOSPITAL_COMMUNITY)
Admission: EM | Admit: 2016-08-13 | Discharge: 2016-08-15 | DRG: 689 | Disposition: A | Discharge status: Home / Self Care | Payer: Medicare Other | Attending: Internal Medicine | Admitting: Internal Medicine

## 2016-08-13 ENCOUNTER — Encounter (HOSPITAL_COMMUNITY): Payer: Self-pay | Admitting: Emergency Medicine

## 2016-08-13 DIAGNOSIS — G9341 Metabolic encephalopathy: Secondary | ICD-10-CM | POA: Diagnosis present

## 2016-08-13 DIAGNOSIS — Z7982 Long term (current) use of aspirin: Secondary | ICD-10-CM

## 2016-08-13 DIAGNOSIS — B9620 Unspecified Escherichia coli [E. coli] as the cause of diseases classified elsewhere: Secondary | ICD-10-CM | POA: Diagnosis present

## 2016-08-13 DIAGNOSIS — G308 Other Alzheimer's disease: Secondary | ICD-10-CM | POA: Diagnosis not present

## 2016-08-13 DIAGNOSIS — I480 Paroxysmal atrial fibrillation: Secondary | ICD-10-CM | POA: Diagnosis not present

## 2016-08-13 DIAGNOSIS — F0281 Dementia in other diseases classified elsewhere with behavioral disturbance: Secondary | ICD-10-CM

## 2016-08-13 DIAGNOSIS — Z801 Family history of malignant neoplasm of trachea, bronchus and lung: Secondary | ICD-10-CM

## 2016-08-13 DIAGNOSIS — R319 Hematuria, unspecified: Secondary | ICD-10-CM | POA: Diagnosis not present

## 2016-08-13 DIAGNOSIS — N390 Urinary tract infection, site not specified: Secondary | ICD-10-CM | POA: Diagnosis present

## 2016-08-13 DIAGNOSIS — F0280 Dementia in other diseases classified elsewhere without behavioral disturbance: Secondary | ICD-10-CM | POA: Diagnosis present

## 2016-08-13 DIAGNOSIS — M545 Low back pain: Secondary | ICD-10-CM | POA: Diagnosis present

## 2016-08-13 DIAGNOSIS — I4891 Unspecified atrial fibrillation: Secondary | ICD-10-CM | POA: Diagnosis present

## 2016-08-13 DIAGNOSIS — I10 Essential (primary) hypertension: Secondary | ICD-10-CM | POA: Diagnosis present

## 2016-08-13 DIAGNOSIS — Z87440 Personal history of urinary (tract) infections: Secondary | ICD-10-CM

## 2016-08-13 DIAGNOSIS — G8929 Other chronic pain: Secondary | ICD-10-CM | POA: Diagnosis present

## 2016-08-13 DIAGNOSIS — R410 Disorientation, unspecified: Secondary | ICD-10-CM | POA: Insufficient documentation

## 2016-08-13 DIAGNOSIS — F419 Anxiety disorder, unspecified: Secondary | ICD-10-CM | POA: Diagnosis present

## 2016-08-13 DIAGNOSIS — G309 Alzheimer's disease, unspecified: Secondary | ICD-10-CM | POA: Diagnosis present

## 2016-08-13 DIAGNOSIS — G500 Trigeminal neuralgia: Secondary | ICD-10-CM | POA: Diagnosis present

## 2016-08-13 DIAGNOSIS — R441 Visual hallucinations: Secondary | ICD-10-CM | POA: Diagnosis present

## 2016-08-13 DIAGNOSIS — Z90710 Acquired absence of both cervix and uterus: Secondary | ICD-10-CM

## 2016-08-13 DIAGNOSIS — Z66 Do not resuscitate: Secondary | ICD-10-CM | POA: Diagnosis present

## 2016-08-13 DIAGNOSIS — N3000 Acute cystitis without hematuria: Secondary | ICD-10-CM | POA: Insufficient documentation

## 2016-08-13 DIAGNOSIS — Z79899 Other long term (current) drug therapy: Secondary | ICD-10-CM | POA: Diagnosis not present

## 2016-08-13 DIAGNOSIS — N179 Acute kidney failure, unspecified: Secondary | ICD-10-CM | POA: Diagnosis present

## 2016-08-13 HISTORY — DX: Urinary tract infection, site not specified: N39.0

## 2016-08-13 HISTORY — DX: Unspecified atrial fibrillation: I48.91

## 2016-08-13 LAB — I-STAT CG4 LACTIC ACID, ED: Lactic Acid, Venous: 2.02 mmol/L (ref 0.5–1.9)

## 2016-08-13 LAB — URINALYSIS, ROUTINE W REFLEX MICROSCOPIC
BILIRUBIN URINE: NEGATIVE
GLUCOSE, UA: NEGATIVE mg/dL
KETONES UR: NEGATIVE mg/dL
NITRITE: POSITIVE — AB
PH: 6 (ref 5.0–8.0)
PROTEIN: NEGATIVE mg/dL
Specific Gravity, Urine: 1.004 — ABNORMAL LOW (ref 1.005–1.030)

## 2016-08-13 LAB — CBC WITH DIFFERENTIAL/PLATELET
BASOS ABS: 0.1 10*3/uL (ref 0.0–0.1)
BASOS PCT: 0 %
Eosinophils Absolute: 0.1 10*3/uL (ref 0.0–0.7)
Eosinophils Relative: 1 %
HEMATOCRIT: 37.2 % (ref 36.0–46.0)
Hemoglobin: 12.5 g/dL (ref 12.0–15.0)
Lymphocytes Relative: 14 %
Lymphs Abs: 2 10*3/uL (ref 0.7–4.0)
MCH: 31.2 pg (ref 26.0–34.0)
MCHC: 33.6 g/dL (ref 30.0–36.0)
MCV: 92.8 fL (ref 78.0–100.0)
MONO ABS: 1 10*3/uL (ref 0.1–1.0)
Monocytes Relative: 7 %
NEUTROS ABS: 11 10*3/uL — AB (ref 1.7–7.7)
Neutrophils Relative %: 78 %
PLATELETS: 363 10*3/uL (ref 150–400)
RBC: 4.01 MIL/uL (ref 3.87–5.11)
RDW: 13.3 % (ref 11.5–15.5)
WBC: 14.1 10*3/uL — ABNORMAL HIGH (ref 4.0–10.5)

## 2016-08-13 LAB — COMPREHENSIVE METABOLIC PANEL
ALT: 12 U/L — ABNORMAL LOW (ref 14–54)
ANION GAP: 14 (ref 5–15)
AST: 22 U/L (ref 15–41)
Albumin: 4 g/dL (ref 3.5–5.0)
Alkaline Phosphatase: 118 U/L (ref 38–126)
BILIRUBIN TOTAL: 0.6 mg/dL (ref 0.3–1.2)
BUN: 37 mg/dL — ABNORMAL HIGH (ref 6–20)
CHLORIDE: 104 mmol/L (ref 101–111)
CO2: 22 mmol/L (ref 22–32)
Calcium: 9.4 mg/dL (ref 8.9–10.3)
Creatinine, Ser: 1.4 mg/dL — ABNORMAL HIGH (ref 0.44–1.00)
GFR calc Af Amer: 36 mL/min — ABNORMAL LOW (ref >60)
GFR, EST NON AFRICAN AMERICAN: 31 mL/min — AB (ref >60)
Glucose, Bld: 108 mg/dL — ABNORMAL HIGH (ref 65–99)
POTASSIUM: 3.5 mmol/L (ref 3.5–5.1)
Sodium: 140 mmol/L (ref 135–145)
TOTAL PROTEIN: 7.1 g/dL (ref 6.5–8.1)

## 2016-08-13 LAB — TROPONIN I: Troponin I: 0.03 ng/mL (ref <0.03)

## 2016-08-13 MED ORDER — ENOXAPARIN SODIUM 40 MG/0.4ML ~~LOC~~ SOLN: 40.0000 mg | SUBCUTANEOUS | Status: DC

## 2016-08-13 MED ORDER — ACETAMINOPHEN 325 MG PO TABS
650.0000 mg | ORAL_TABLET | Freq: Four times a day (QID) | ORAL | Status: DC | PRN
  Administered 2016-08-14: 650 mg via ORAL
  Filled 2016-08-13: qty 2

## 2016-08-13 MED ORDER — LORAZEPAM 2 MG/ML IJ SOLN
0.5000 mg | INTRAMUSCULAR | Status: DC | PRN
  Administered 2016-08-13: 1 mg via INTRAVENOUS
  Filled 2016-08-13: qty 1

## 2016-08-13 MED ORDER — ASPIRIN EC 325 MG PO TBEC
325.0000 mg | DELAYED_RELEASE_TABLET | Freq: Every day | ORAL | Status: DC
  Administered 2016-08-13 – 2016-08-15 (×3): 325 mg via ORAL
  Filled 2016-08-13 (×3): qty 1

## 2016-08-13 MED ORDER — ONDANSETRON HCL 4 MG/2ML IJ SOLN: 4.0000 mg | Freq: Four times a day (QID) | INTRAMUSCULAR | Status: DC | PRN

## 2016-08-13 MED ORDER — METOPROLOL TARTRATE 5 MG/5ML IV SOLN
5.0000 mg | Freq: Once | INTRAVENOUS | Status: AC
  Administered 2016-08-13: 5 mg via INTRAVENOUS
  Filled 2016-08-13: qty 5

## 2016-08-13 MED ORDER — CARBAMAZEPINE 100 MG PO CHEW
100.0000 mg | CHEWABLE_TABLET | Freq: Four times a day (QID) | ORAL | Status: DC | PRN
  Administered 2016-08-14 – 2016-08-15 (×2): 100 mg via ORAL
  Filled 2016-08-13 (×5): qty 1

## 2016-08-13 MED ORDER — ACETAMINOPHEN 650 MG RE SUPP: 650.0000 mg | Freq: Four times a day (QID) | RECTAL | Status: DC | PRN

## 2016-08-13 MED ORDER — MEMANTINE HCL 10 MG PO TABS
10.0000 mg | ORAL_TABLET | Freq: Two times a day (BID) | ORAL | Status: DC
  Administered 2016-08-13 – 2016-08-15 (×4): 10 mg via ORAL
  Filled 2016-08-13 (×5): qty 1

## 2016-08-13 MED ORDER — HALOPERIDOL LACTATE 5 MG/ML IJ SOLN: 2.0000 mg | Freq: Four times a day (QID) | INTRAMUSCULAR | Status: DC | PRN

## 2016-08-13 MED ORDER — HEPARIN SODIUM (PORCINE) 5000 UNIT/ML IJ SOLN
5000.0000 [IU] | Freq: Three times a day (TID) | INTRAMUSCULAR | Status: DC
  Administered 2016-08-14 – 2016-08-15 (×4): 5000 [IU] via SUBCUTANEOUS
  Filled 2016-08-13 (×4): qty 1

## 2016-08-13 MED ORDER — SODIUM CHLORIDE 0.9% FLUSH
3.0000 mL | Freq: Two times a day (BID) | INTRAVENOUS | Status: DC
  Administered 2016-08-14 – 2016-08-15 (×3): 3 mL via INTRAVENOUS

## 2016-08-13 MED ORDER — SODIUM CHLORIDE 0.9 % IV BOLUS (SEPSIS)
500.0000 mL | Freq: Once | INTRAVENOUS | Status: AC
  Administered 2016-08-13: 500 mL via INTRAVENOUS

## 2016-08-13 MED ORDER — QUETIAPINE FUMARATE 25 MG PO TABS
12.5000 mg | ORAL_TABLET | Freq: Every evening | ORAL | Status: DC | PRN
  Filled 2016-08-13: qty 1

## 2016-08-13 MED ORDER — METOPROLOL SUCCINATE ER 25 MG PO TB24
25.0000 mg | ORAL_TABLET | Freq: Every day | ORAL | Status: DC
  Administered 2016-08-13 – 2016-08-15 (×3): 25 mg via ORAL
  Filled 2016-08-13 (×3): qty 1

## 2016-08-13 MED ORDER — DEXTROSE 5 % IV SOLN
1.0000 g | Freq: Once | INTRAVENOUS | Status: AC
  Administered 2016-08-13: 1 g via INTRAVENOUS
  Filled 2016-08-13: qty 10

## 2016-08-13 MED ORDER — ONDANSETRON HCL 4 MG PO TABS: 4.0000 mg | ORAL_TABLET | Freq: Four times a day (QID) | ORAL | Status: DC | PRN

## 2016-08-13 MED ORDER — SODIUM CHLORIDE 0.9 % IV SOLN
INTRAVENOUS | Status: DC
  Administered 2016-08-13 – 2016-08-14 (×2): via INTRAVENOUS

## 2016-08-13 MED ORDER — SULFAMETHOXAZOLE-TRIMETHOPRIM 400-80 MG/5ML IV SOLN: 160.0000 mg | Freq: Two times a day (BID) | INTRAVENOUS | Status: DC

## 2016-08-13 MED ORDER — AMPICILLIN-SULBACTAM SODIUM 1.5 (1-0.5) G IJ SOLR
1.5000 g | Freq: Two times a day (BID) | INTRAMUSCULAR | Status: DC
  Administered 2016-08-14 – 2016-08-15 (×3): 1.5 g via INTRAVENOUS
  Filled 2016-08-13 (×3): qty 1.5

## 2016-08-13 MED ORDER — ALPRAZOLAM 0.25 MG PO TABS
0.2500 mg | ORAL_TABLET | Freq: Every evening | ORAL | Status: DC | PRN
Start: 2016-08-13 — End: 2016-08-13

## 2016-08-13 MED ORDER — DONEPEZIL HCL 10 MG PO TABS
10.0000 mg | ORAL_TABLET | Freq: Every morning | ORAL | Status: DC
  Administered 2016-08-13 – 2016-08-15 (×3): 10 mg via ORAL
  Filled 2016-08-13 (×3): qty 1

## 2016-08-13 NOTE — ED Notes (Signed)
Son states patient has a history of dementia however when she gets a UTI she starts getting confused and talks non stop. Presently alert oriented to place only confused as to what year and month it is. Son states patient started getting confused yest around lunch time. States it is getting worse he found her up this am cleaning .

## 2016-08-13 NOTE — Progress Notes (Signed)
Got report from RidgwayJessica in ED

## 2016-08-13 NOTE — Progress Notes (Addendum)
Jodi Beltran [id number] Admission Data: 08/13/2016 6:26 PM Attending Provider: Ozella Rocksavid J Merrell, MD  WUJ:WJXBJYNW,GNFAOZPCP:PATERSON,DANIEL G, MD Consults/ Treatment Team:   Jodi Beltran is a 83 y.o. female patient admitted from ED awake, alert  & orientated  X 3,  DNR, VSS - Blood pressure (!) 143/67, pulse 77, temperature 98.7 F (37.1 C), resp. rate 18, height 5\' 8"  (1.727 m), weight 56.2 kg (123 lb 14.4 oz), SpO2 97 %., no c/o shortness of breath, no c/o chest pain, no distress noted. Tele # 18 placed and pt is currently running:normal sinus rhythm.   IV site WDL:  forearm right, condition patent and no redness with a transparent dsg that's clean dry and intact.  Allergies:   Allergies  Allergen Reactions  . Ceftriaxone Other (See Comments)    Causes pt to become anxious and more delirious. Wont sleep  . Other Other (See Comments)    Maple tree pollen - reaction unknown     Past Medical History:  Diagnosis Date  . Alzheimer disease   . Arthritis   . Atrial fibrillation (HCC) 07/2016  . Dementia   . Tic douloureux   . Tic douloureux   . UTI (urinary tract infection) 07/2016    History:  obtained from child. Tobacco/alcohol: denied none  Pt orientation to unit, room and routine. Information packet given to patient/family and safety video watched.  Admission INP armband ID verified with patient/family, and in place. SR up x 2, fall risk assessment complete with Patient and family verbalizing understanding of risks associated with falls. Pt verbalizes an understanding of how to use the call bell and to call for help before getting out of bed.  Skin, clean-dry- intact without evidence of bruising, or skin tears.   No evidence of skin break down noted on exam. ecchymoses - lower leg(s) right  Son, Felicity Pellegrinied aware of camera room and is ok with that.     Will cont to monitor and assist as needed.  Salle Brandle Consuella Loselaine, RN 08/13/2016 6:26 PM

## 2016-08-13 NOTE — H&P (Signed)
History and Physical    Jodi Pufferlsie S Aman ZOX:[id number] DOB: 10-15-1920 DOA: 08/13/2016  PCP: Garlan FillersPATERSON,DANIEL G, MD Patient coming from: home  Chief Complaint: change in mental status  HPI: Jodi Beltran is a 83 y.o. female with medical history significant of advanced dementia, tic douloureux, presenting w/ 1+ days of worsening confusion and increased anxiety w/ visual hallucinations. Level V caveat applies. History provided by patient's son who is her primary caregiver and power of attorney. Per patient's son this is her usual presentation when she has some form of an infection. She often times will sleep for days on end. Patient has no focal complaints at this time and does not exhibit any symptoms such as focal neurological deficit, chest pain, shortness of breath, palpitations, neck stiffness, headache, LOC, dizziness, vertigo, fall, abdominal pain, dysuria, frequency, back pain, fevers. Patient's change in mental status is constant and getting worse. Per report patient was treated for a UTI approximately 8 days ago with Macrobid per PCP. This was given due to a positive urinalysis and symptoms as described above. Patient's mental status improved while on the antibiotic and after stopping initially. Symptoms returned as described above. Patient has been off antibiotics for approximately 5 days.  ED Course: Objective findings outlined below. Suggestive of UTI. Started on ceftriaxone.  Review of Systems: As per HPI otherwise 10 point review of systems negative.   Ambulatory Status: no restrictions.   Past Medical History:  Diagnosis Date  . Alzheimer disease   . Arthritis   . Dementia   . Tic douloureux   . Tic douloureux     Past Surgical History:  Procedure Laterality Date  . ABDOMINAL HYSTERECTOMY    . BACK SURGERY    . bladder tact    . HIP PINNING,CANNULATED Right 01/05/2013   Procedure: CANNULATED HIP PINNING RIGHT HIP;  Surgeon: Venita Lickahari Brooks, MD;  Location: [facility] OR;  Service:  Orthopedics;  Laterality: Right;  . tumors removal benign      Social History   Social History  . Marital status: Widowed    Spouse name: N/A  . Number of children: 2  . Years of education: 9th   Occupational History  .  Retired    Retired   Social History Main Topics  . Smoking status: Never Smoker  . Smokeless tobacco: Never Used  . Alcohol use No  . Drug use: No  . Sexual activity: No   Other Topics Concern  . Not on file   Social History Narrative   Patient goes back and fourth between sons house and hers.   Retired.   Education. 9 th grade education.   Right handed.   Caffeine- two cups daily.    Patient is  a widow.    Allergies  Allergen Reactions  . Ceftriaxone Other (See Comments)    Causes pt to become anxious and more delirious. Wont sleep  . Other Other (See Comments)    Maple tree pollen - reaction unknown    Family History  Problem Relation Age of Onset  . Lung cancer Mother     Prior to Admission medications   Medication Sig Start Date End Date Taking? Authorizing Provider  ALPRAZolam (XANAX) 0.25 MG tablet Take 1 tablet (0.25 mg total) by mouth at bedtime as needed for anxiety. 09/18/15  Yes Joseph ArtJessica U Vann, DO  aspirin EC 325 MG tablet Take 1 tablet (325 mg total) by mouth daily. 01/07/13  Yes Venita Lickahari Brooks, MD  carbamazepine (TEGRETOL) 100 MG chewable  tablet Chew 1 tablet (100 mg total) by mouth 4 (four) times daily as needed (pain). Patient taking differently: Chew 100 mg by mouth See admin instructions. Take 1 tablet daily. Can take another tablet as needed for pain 09/25/15  Yes Levert Feinstein, MD  donepezil (ARICEPT) 10 MG tablet Take 1 tablet (10 mg total) by mouth every morning. 09/25/15  Yes Levert Feinstein, MD  Liniments [facility]) PADS Apply 1 each topically daily as needed (pain).   Yes Historical Provider, MD  memantine (NAMENDA) 10 MG tablet Take 1 tablet (10 mg total) by mouth 2 (two) times daily. 09/25/15  Yes Levert Feinstein, MD  metoprolol succinate  (TOPROL-XL) 25 MG 24 hr tablet Take 1 tablet (25 mg total) by mouth daily. 09/18/15  Yes Joseph Art, DO  Multiple Vitamins-Minerals (MULTIVITAMIN WITH MINERALS) tablet Take 1 tablet by mouth daily.   Yes Historical Provider, MD  QUEtiapine (SEROQUEL) 25 MG tablet Take 0.5 tablets (12.5 mg total) by mouth at bedtime. Patient taking differently: Take 12.5 mg by mouth at bedtime as needed (anxiety).  09/18/15  Yes Joseph Art, DO    Physical Exam: Vitals:   08/13/16 0516 08/13/16 0733 08/13/16 0800 08/13/16 0815  BP: 109/68 131/73 147/81   Pulse: 71 (!) 147 (!) 139 100  Resp: 19 19 15 19   Temp:      TempSrc:      SpO2: 95% 99% 96% 96%     General: Hypervigilant, resting in bed comfortably, chatty  Eyes:  PERRL, EOMI, normal lids, iris ENT:  grossly normal hearing, lips & tongue, mmm Neck:  no LAD, masses or thyromegaly Cardiovascular:  RRR, no m/r/g. No LE edema.  Respiratory:  CTA bilaterally, no w/r/r. Normal respiratory effort. Abdomen:  soft, ntnd, NABS Skin:  no rash or induration seen on limited exam Musculoskeletal:  grossly normal tone BUE/BLE, good ROM, no bony abnormality Psychiatric: Alert and oriented 1, interactive and pleasant, follows basic commands Neurologic:  CN 2-12 grossly intact, moves all extremities in coordinated fashion, sensation intact  Labs on Admission: I have personally reviewed following labs and imaging studies  CBC:  Recent Labs Lab 08/13/16 0300  WBC 14.1*  NEUTROABS 11.0*  HGB 12.5  HCT 37.2  MCV 92.8  PLT 363   Basic Metabolic Panel:  Recent Labs Lab 08/13/16 0300  NA 140  K 3.5  CL 104  CO2 22  GLUCOSE 108*  BUN 37*  CREATININE 1.40*  CALCIUM 9.4   GFR: CrCl cannot be calculated (Unknown ideal weight.). Liver Function Tests:  Recent Labs Lab 08/13/16 0300  AST 22  ALT 12*  ALKPHOS 118  BILITOT 0.6  PROT 7.1  ALBUMIN 4.0   No results for input(s): LIPASE, AMYLASE in the last 168 hours. No results for  input(s): AMMONIA in the last 168 hours. Coagulation Profile: No results for input(s): INR, PROTIME in the last 168 hours. Cardiac Enzymes: No results for input(s): CKTOTAL, CKMB, CKMBINDEX, TROPONINI in the last 168 hours. BNP (last 3 results) No results for input(s): PROBNP in the last 8760 hours. HbA1C: No results for input(s): HGBA1C in the last 72 hours. CBG: No results for input(s): GLUCAP in the last 168 hours. Lipid Profile: No results for input(s): CHOL, HDL, LDLCALC, TRIG, CHOLHDL, LDLDIRECT in the last 72 hours. Thyroid Function Tests: No results for input(s): TSH, T4TOTAL, FREET4, T3FREE, THYROIDAB in the last 72 hours. Anemia Panel: No results for input(s): VITAMINB12, FOLATE, FERRITIN, TIBC, IRON, RETICCTPCT in the last 72 hours. Urine  analysis:    Component Value Date/Time   COLORURINE YELLOW 08/13/2016 0320   APPEARANCEUR HAZY (A) 08/13/2016 0320   LABSPEC 1.004 (L) 08/13/2016 0320   PHURINE 6.0 08/13/2016 0320   GLUCOSEU NEGATIVE 08/13/2016 0320   HGBUR SMALL (A) 08/13/2016 0320   BILIRUBINUR NEGATIVE 08/13/2016 0320   KETONESUR NEGATIVE 08/13/2016 0320   PROTEINUR NEGATIVE 08/13/2016 0320   UROBILINOGEN 0.2 07/11/2013 1614   NITRITE POSITIVE (A) 08/13/2016 0320   LEUKOCYTESUR LARGE (A) 08/13/2016 0320    Creatinine Clearance: CrCl cannot be calculated (Unknown ideal weight.).  Sepsis Labs: @LABRCNTIP (procalcitonin:4,lacticidven:4) )No results found for this or any previous visit (from the past 240 hour(s)).   Radiological Exams on Admission: No results found.  EKG: Independently reviewed. Afib. RVR.   Assessment/Plan Active Problems:   Tic douloureux   Alzheimer's disease   Hypertension   Atrial fibrillation with rapid ventricular response (HCC)   Urinary tract infection   Acute metabolic encephalopathy    Acute encephalopathy/delirium: Patient with baseline advanced dementia. Pleasant but confused at baseline. Worsening of symptoms likely  secondary to UTI as this is her typical presenting complaint. Currently endorsing visual hallucinations, pleasant but hypervigilant. No focal abnormality. Workup consistent with UTI. No significant metabolic derangements, AF VSS. - Ativan and Haldol when necessary - Treatment of urinary tract infection as below - Continue Aricept and Namenda - continue Seroquel  Urinary tract infection: Urine positive for UTI. WBC 14.1 with left shift. Patient completed antibiotics/Macrobid, 5 days ago for a UTI treated by her primary care physician. Last urine culture showed greater than 100,000 colonies of Escherichia coli which was resistant to ciprofloxacin but otherwise pansensitive. Family reports patient's mental status is worse on a certain antibiotic used to treat urinary tract infections. Family is unsure which antibiotic this is but states that it was the one given to her in the hospital during her last admission. Per review of chart patient was treated with ceftriaxone during her last admission. - Start Unasyn (need 7 days of total treatment as she failed recent 3 day regimen in the outpatient setting) - Follow-up urine culture  AK I: Creatinine 1.4. Baseline 1. Likely secondary to poor oral intake and UTI. Lactic acid 2.02. - IVF - BMP in a.m.  Afib RVR: History of the same with UTIs. Complaint with home beta blocker. Not an anticoagulation candidate. RVR resolved after IV metoprolol 5 mg. - Continue home metoprolol 25 mg - Continue on telemetry - Troponin 1 - IV Lopressor when necessary. Per report IV diltiazem does not work.  Tic douloureux:  - Continue Tegretol  Hypertension: - The Metoprolol  DVT prophylaxis:  lovenox Code Status: DNR  Family Communication: daughter  Disposition Plan: pending improvement  Consults called: none  Admission status: inpt    MERRELL, DAVID J MD [facility]  If 7PM-7AM, please contact night-coverage [email or web address] Password [facility]  08/13/2016,  9:37 AM

## 2016-08-13 NOTE — ED Provider Notes (Signed)
[facility]-EMERGENCY DEPT Provider Note   CSN: [id number] Arrival date & time: 08/13/16  0254     History   Chief Complaint Chief Complaint  Patient presents with  . Confused/ Dementia    HPI Jodi Beltran is a 83 y.o. female.  Patient with history of urinary tract infections, rapid atrial fibrillation on metoprolol, dementia -- presents with son with complaint of altered mental status. Patient has been more confused than her baseline since yesterday afternoon. Patient would not sleep last night and son awoke to find her cleaning the house. She is confused about family members and thinks that her son is her husband. Patient has had similar symptoms with previous urinary tract infections, most recent hospitalization 08/2015. At that time patient was found to be in atrial fibrillation with rapid ventricular sponsor. This was responsive to metoprolol not diltiazem. Patient was placed on oral metoprolol for this. Urine grew Escherichia coli. Otherwise patient has had slight cough. No URI or other influenza type symptoms. No reported abdominal pain, vomiting, or diarrhea. The onset of this condition was acute. The course is constant. Aggravating factors: none. Alleviating factors: none.        Past Medical History:  Diagnosis Date  . Alzheimer disease   . Arthritis   . Dementia   . Tic douloureux   . Tic douloureux     Patient Active Problem List   Diagnosis Date Noted  . UTI (urinary tract infection) 09/16/2015  . Atrial fibrillation with rapid ventricular response (HCC) 09/15/2015  . Atrial fibrillation with tachycardic ventricular rate (HCC) 09/15/2015  . Acute blood loss anemia 01/23/2013  . Constipation 01/23/2013  . Closed right hip fracture (HCC) 01/05/2013  . Hypertension 01/05/2013  . Hip fracture requiring operative repair (HCC) 01/05/2013  . Dehydration 09/20/2012    Class: Acute  . Acute pharyngitis 09/20/2012    Class: Acute  . Odynophagia 09/20/2012    Class: Acute   . Tic douloureux 09/20/2012    Class: Chronic  . Alzheimer's disease 09/20/2012    Class: Chronic  . Hearing loss 09/20/2012    Class: Chronic  . Microscopic hematuria 09/20/2012    Class: Chronic  . Low back pain 09/20/2012    Class: Chronic    Past Surgical History:  Procedure Laterality Date  . ABDOMINAL HYSTERECTOMY    . BACK SURGERY    . bladder tact    . HIP PINNING,CANNULATED Right 01/05/2013   Procedure: CANNULATED HIP PINNING RIGHT HIP;  Surgeon: Venita Lick, MD;  Location: [facility] OR;  Service: Orthopedics;  Laterality: Right;  . tumors removal benign      OB History    No data available       Home Medications    Prior to Admission medications   Medication Sig Start Date End Date Taking? Authorizing Provider  ALPRAZolam (XANAX) 0.25 MG tablet Take 1 tablet (0.25 mg total) by mouth at bedtime as needed for anxiety. 09/18/15   Joseph Art, DO  aspirin EC 325 MG tablet Take 1 tablet (325 mg total) by mouth daily. 01/07/13   Venita Lick, MD  carbamazepine (TEGRETOL) 100 MG chewable tablet Chew 1 tablet (100 mg total) by mouth 4 (four) times daily as needed (pain). 09/25/15   Levert Feinstein, MD  donepezil (ARICEPT) 10 MG tablet Take 1 tablet (10 mg total) by mouth every morning. 09/25/15   Levert Feinstein, MD  [facility]) PADS Apply 1 each topically daily as needed (pain).    Historical Provider, MD  memantine (NAMENDA) 10 MG tablet Take 1 tablet (10 mg total) by mouth 2 (two) times daily. 09/25/15   Levert FeinsteinYijun Yan, MD  metoprolol succinate (TOPROL-XL) 25 MG 24 hr tablet Take 1 tablet (25 mg total) by mouth daily. 09/18/15   Joseph ArtJessica U Vann, DO  Multiple Vitamins-Minerals (MULTIVITAMIN WITH MINERALS) tablet Take 1 tablet by mouth daily.    Historical Provider, MD  QUEtiapine (SEROQUEL) 25 MG tablet Take 0.5 tablets (12.5 mg total) by mouth at bedtime. 09/18/15   Joseph ArtJessica U Vann, DO    Family History Family History  Problem Relation Age of Onset  . Lung cancer Mother     Social  History Social History  Substance Use Topics  . Smoking status: Never Smoker  . Smokeless tobacco: Never Used  . Alcohol use No     Allergies   Other   Review of Systems Review of Systems  Unable to perform ROS: Dementia     Physical Exam Updated Vital Signs BP 109/68 (BP Location: Right Arm)   Pulse 71   Temp 98.9 F (37.2 C) (Oral)   Resp 19   SpO2 95%   Physical Exam  Constitutional: She appears well-developed and well-nourished.  HENT:  Head: Normocephalic and atraumatic.  Right Ear: Tympanic membrane, external ear and ear canal normal.  Left Ear: Tympanic membrane, external ear and ear canal normal.  Nose: Nose normal. No mucosal edema or rhinorrhea.  Mouth/Throat: Uvula is midline and oropharynx is clear and moist. No oropharyngeal exudate, posterior oropharyngeal edema or posterior oropharyngeal erythema.  Eyes: Conjunctivae are normal. Right eye exhibits no discharge. Left eye exhibits no discharge.  Neck: Normal range of motion. Neck supple. No JVD present.  Cardiovascular: Normal heart sounds.  An irregularly irregular rhythm present. Tachycardia present.   No murmur heard. Pulmonary/Chest: Effort normal and breath sounds normal. No respiratory distress. She has no wheezes. She has no rales.  Abdominal: Soft. She exhibits no mass. There is tenderness (Minimal, epigastric). There is no guarding.  Neurological: She is alert.  Pt with confused speech  Skin: Skin is warm and dry.  Psychiatric: She has a normal mood and affect.  Nursing note and vitals reviewed.    ED Treatments / Results  Labs (all labs ordered are listed, but only abnormal results are displayed) Labs Reviewed  CBC WITH DIFFERENTIAL/PLATELET - Abnormal; Notable for the following:       Result Value   WBC 14.1 (*)    Neutro Abs 11.0 (*)    All other components within normal limits  COMPREHENSIVE METABOLIC PANEL - Abnormal; Notable for the following:    Glucose, Bld 108 (*)    BUN 37  (*)    Creatinine, Ser 1.40 (*)    ALT 12 (*)    GFR calc non Af Amer 31 (*)    GFR calc Af Amer 36 (*)    All other components within normal limits  URINALYSIS, ROUTINE W REFLEX MICROSCOPIC - Abnormal; Notable for the following:    APPearance HAZY (*)    Specific Gravity, Urine 1.004 (*)    Hgb urine dipstick SMALL (*)    Nitrite POSITIVE (*)    Leukocytes, UA LARGE (*)    Bacteria, UA RARE (*)    Squamous Epithelial / LPF 0-5 (*)    All other components within normal limits  URINE CULTURE  I-STAT CG4 LACTIC ACID, ED    EKG  EKG Interpretation  Date/Time:  Wednesday August 13 2016 07:01:24 EST Ventricular Rate:  142 PR Interval:    QRS Duration: 94 QT Interval:  292 QTC Calculation: 449 R Axis:   94 Text Interpretation:  Atrial fibrillation with rapid V-rate Right axis deviation Repolarization abnormality, prob rate related No significant change since last tracing Confirmed by WARD,  DO, KRISTEN ([id number]) on 08/13/2016 7:14:31 AM      Procedures Procedures (including critical care time)  Medications Ordered in ED Medications  cefTRIAXone (ROCEPHIN) 1 g in dextrose 5 % 50 mL IVPB (not administered)     Initial Impression / Assessment and Plan / ED Course  I have reviewed the triage vital signs and the nursing notes.  Pertinent labs & imaging results that were available during my care of the patient were reviewed by me and considered in my medical decision making (see chart for details).     Patient seen and examined. Work-up initiated. Medications ordered.   Vital signs reviewed and are as follows: BP 109/68 (BP Location: Right Arm)   Pulse 71   Temp 98.9 F (37.2 C) (Oral)   Resp 19   SpO2 95%   7:28 AM Discussed with and seen by Dr. Elesa Massed. Will admit.   8:15 AM Good response to metoprolol. HR 100-115.   8:38 AM Spoke with Dr. Konrad Dolores. Will request tele obs bed.  Final Clinical Impressions(s) / ED Diagnoses   Final diagnoses:  Acute cystitis without  hematuria  Acute delirium  Atrial fibrillation with RVR (HCC)   Admit.   New Prescriptions Current Discharge Medication List       Renne Crigler, [place] 08/13/16 0840

## 2016-08-13 NOTE — ED Provider Notes (Signed)
Medical screening examination/treatment/procedure(s) were conducted as a shared visit with non-physician practitioner(s) and myself.  I personally evaluated the patient during the encounter.   EKG Interpretation  Date/Time:  Wednesday August 13 2016 07:01:24 EST Ventricular Rate:  142 PR Interval:    QRS Duration: 94 QT Interval:  292 QTC Calculation: 449 R Axis:   94 Text Interpretation:  Atrial fibrillation with rapid V-rate Right axis deviation Repolarization abnormality, prob rate related No significant change since last tracing Confirmed by Jashanti Clinkscale,  DO, Ayoub Arey [id number]) on 08/13/2016 7:14:31 AM      Pt is a 83 y.o. female with previous history of atrial fibrillation not on anticoagulation who presents to the emergency department with altered status, hallucinations, delirium. Patient had similar symptoms previously with a urinary tract infection. Today patient is tachycardic, and atrial fibrillation with RVR. Afebrile, nontoxic appearing and well hydrated. She denies any current pain. Labs show mild leukocytosis with left shift. She does have a nitrite-positive UTI. Previous cultures have grown Escherichia coli that was resistant to penicillins, fluoroquinolones. Has done well with ceftriaxone in the past. Her atrial fibrillation with RVR in the past has not responded well with diltiazem but did respond well with metoprolol. We'll give metoprolol in the ED. Patient will need admission.   Jodi MawKristen N Yzabella Crunk, DO 08/13/16 [phone]

## 2016-08-13 NOTE — ED Triage Notes (Signed)
Son reported worsening dementia and confusion for the past several days , he is concerned about UTI and Flu , mild rhinorrhea , no cough or fever .

## 2016-08-13 NOTE — ED Notes (Signed)
Meal Tray Ordered.  

## 2016-08-13 NOTE — ED Notes (Signed)
Pt is becoming very restless. Gave pt warm blanket to comfort her.

## 2016-08-14 DIAGNOSIS — G9341 Metabolic encephalopathy: Secondary | ICD-10-CM

## 2016-08-14 DIAGNOSIS — F0280 Dementia in other diseases classified elsewhere without behavioral disturbance: Secondary | ICD-10-CM

## 2016-08-14 DIAGNOSIS — G308 Other Alzheimer's disease: Secondary | ICD-10-CM

## 2016-08-14 DIAGNOSIS — N390 Urinary tract infection, site not specified: Secondary | ICD-10-CM

## 2016-08-14 DIAGNOSIS — I480 Paroxysmal atrial fibrillation: Secondary | ICD-10-CM

## 2016-08-14 LAB — COMPREHENSIVE METABOLIC PANEL
ALBUMIN: 2.7 g/dL — AB (ref 3.5–5.0)
ALT: 11 U/L — ABNORMAL LOW (ref 14–54)
ANION GAP: 6 (ref 5–15)
AST: 18 U/L (ref 15–41)
Alkaline Phosphatase: 88 U/L (ref 38–126)
BILIRUBIN TOTAL: 0.6 mg/dL (ref 0.3–1.2)
BUN: 27 mg/dL — AB (ref 6–20)
CHLORIDE: 113 mmol/L — AB (ref 101–111)
CO2: 23 mmol/L (ref 22–32)
Calcium: 8.3 mg/dL — ABNORMAL LOW (ref 8.9–10.3)
Creatinine, Ser: 0.93 mg/dL (ref 0.44–1.00)
GFR calc Af Amer: 60 mL/min — ABNORMAL LOW (ref >60)
GFR calc non Af Amer: 51 mL/min — ABNORMAL LOW (ref >60)
GLUCOSE: 95 mg/dL (ref 65–99)
POTASSIUM: 3.6 mmol/L (ref 3.5–5.1)
Sodium: 142 mmol/L (ref 135–145)
TOTAL PROTEIN: 5.4 g/dL — AB (ref 6.5–8.1)

## 2016-08-14 LAB — CBC
HEMATOCRIT: 33.9 % — AB (ref 36.0–46.0)
Hemoglobin: 10.9 g/dL — ABNORMAL LOW (ref 12.0–15.0)
MCH: 30.6 pg (ref 26.0–34.0)
MCHC: 32.2 g/dL (ref 30.0–36.0)
MCV: 95.2 fL (ref 78.0–100.0)
PLATELETS: 257 10*3/uL (ref 150–400)
RBC: 3.56 MIL/uL — ABNORMAL LOW (ref 3.87–5.11)
RDW: 13.7 % (ref 11.5–15.5)
WBC: 6.4 10*3/uL (ref 4.0–10.5)

## 2016-08-14 NOTE — Progress Notes (Signed)
PROGRESS NOTE                                                                                                                                                                                                             Patient Demographics:    Jodi Beltran, is a 83 y.o. female, DOB - 1920-11-15, ZOX:[id number]  Admit date - 08/13/2016   Admitting Physician Ozella Rocksavid J Merrell, MD  Outpatient Primary MD for the patient is Garlan FillersPATERSON,DANIEL G, MD  LOS - 1  Outpatient Specialists:None  Chief Complaint  Patient presents with  . Confused/ Dementia       Brief Narrative  83 y.o. female with medical history significant of advanced dementia, tic douloureux, presenting w/ 1+ days of worsening confusion and increased anxiety w/ visual hallucinations. She was recently treated with a course of Macrobid one week prior to hospitalization for UTI. Family informed that she has mental status changes with UTI in the past. At baseline she is mostly independent with her ADLs. In the ED vitals were stable. WBC elevated (14 K with predominant neutrophils) with UA suggestive of UTI and was admitted to hospitalist service. She also had transient rapid A. fib on admission.   Subjective:   Patient is somnolent but easily arousable. He is able to identify her son andusing the hospital.   Assessment  & Plan :   Principal problem Acute metabolic encephalopathy secondary to UTI Monitor on telemetry. Empiric Unasyn. Prior urine culture growing Escherichia coli resistant to ciprofloxacin. Urine culture this admission growing gram-negative rods. Supportive care with Tylenol. Avoid narcotics or benzodiazepine.  A. fib with RVR Suspect secondary to UTI. Not a candidate for anticoagulation. Resolved after her dose of IV metoprolol in the ED. Currently stable on monitor. Resume home dose metoprolol.  Tic douloureux:  - Continue Tegretol  Hypertension: -  Continue Metoprolol  Alzheimer's Dementia, moderate Continue Namenda and Aricept    Code Status : DO NOT RESUSCITATE  Family Communication  : Son at bedside  Disposition Plan  : Home once improved, possibly in the next 24-48 hours  Barriers For Discharge : Active symptoms  Consults  :  None  Procedures  : None  DVT Prophylaxis  :  Lovenox -   Lab Results  Component Value Date   PLT 257 08/14/2016    Antibiotics  :  Anti-infectives    Start     Dose/Rate Route Frequency Ordered Stop   08/14/16 0800  sulfamethoxazole-trimethoprim (BACTRIM) 160 mg in dextrose 5 % 250 mL IVPB  Status:  Discontinued     160 mg 260 mL/hr over 60 Minutes Intravenous Every 12 hours 08/13/16 0935 08/13/16 1129   08/14/16 0800  ampicillin-sulbactam (UNASYN) 1.5 g in sodium chloride 0.9 % 50 mL IVPB     1.5 g 100 mL/hr over 30 Minutes Intravenous Every 12 hours 08/13/16 1129     08/13/16 0700  cefTRIAXone (ROCEPHIN) 1 g in dextrose 5 % 50 mL IVPB     1 g 100 mL/hr over 30 Minutes Intravenous  Once 08/13/16 0652 08/13/16 1228        Objective:   Vitals:   08/13/16 1633 08/13/16 2120 08/14/16 0627 08/14/16 0933  BP: (!) 143/67 (!) 150/74 (!) 149/61 (!) 155/47  Pulse: 77 78 75 77  Resp: 18 18 18    Temp: 98.7 F (37.1 C) 98 F (36.7 C) 97.5 F (36.4 C)   TempSrc:  Oral Oral   SpO2: 97% 97% 99% 95%  Weight: 56.2 kg (123 lb 14.4 oz)     Height: 5\' 8"  (1.727 m)       Wt Readings from Last 3 Encounters:  08/13/16 56.2 kg (123 lb 14.4 oz)  09/25/15 60.8 kg (134 lb)  09/16/15 63.3 kg (139 lb 8.8 oz)     Intake/Output Summary (Last 24 hours) at 08/14/16 1326 Last data filed at 08/14/16 0600  Gross per 24 hour  Intake          1038.75 ml  Output                0 ml  Net          1038.75 ml     Physical Exam  Gen: not in distress, Somnolent but easily arousable HEENT:  moist mucosa, supple neck Chest: clear b/l, no added sounds CVS: N S1&S2, no murmurs, rubs or gallop GI:  soft, NT, ND, BS+ Musculoskeletal: warm, no edema CNS: AAOX1-2    Data Review:    CBC  Recent Labs Lab 08/13/16 0300 08/14/16 0657  WBC 14.1* 6.4  HGB 12.5 10.9*  HCT 37.2 33.9*  PLT 363 257  MCV 92.8 95.2  MCH 31.2 30.6  MCHC 33.6 32.2  RDW 13.3 13.7  LYMPHSABS 2.0  --   MONOABS 1.0  --   EOSABS 0.1  --   BASOSABS 0.1  --     Chemistries   Recent Labs Lab 08/13/16 0300 08/14/16 0657  NA 140 142  K 3.5 3.6  CL 104 113*  CO2 22 23  GLUCOSE 108* 95  BUN 37* 27*  CREATININE 1.40* 0.93  CALCIUM 9.4 8.3*  AST 22 18  ALT 12* 11*  ALKPHOS 118 88  BILITOT 0.6 0.6   ------------------------------------------------------------------------------------------------------------------ No results for input(s): CHOL, HDL, LDLCALC, TRIG, CHOLHDL, LDLDIRECT in the last 72 hours.  No results found for: HGBA1C ------------------------------------------------------------------------------------------------------------------ No results for input(s): TSH, T4TOTAL, T3FREE, THYROIDAB in the last 72 hours.  Invalid input(s): FREET3 ------------------------------------------------------------------------------------------------------------------ No results for input(s): VITAMINB12, FOLATE, FERRITIN, TIBC, IRON, RETICCTPCT in the last 72 hours.  Coagulation profile No results for input(s): INR, PROTIME in the last 168 hours.  No results for input(s): DDIMER in the last 72 hours.  Cardiac Enzymes  Recent Labs Lab 08/13/16 1034  TROPONINI 0.03*   ------------------------------------------------------------------------------------------------------------------ No results found for: BNP  Inpatient Medications  Scheduled Meds: . ampicillin-sulbactam (UNASYN) IV  1.5 g Intravenous Q12H  . aspirin EC  325 mg Oral Daily  . donepezil  10 mg Oral q morning - 10a  . heparin  5,000 Units Subcutaneous Q8H  . memantine  10 mg Oral BID  . metoprolol succinate  25 mg Oral Daily    . sodium chloride flush  3 mL Intravenous Q12H   Continuous Infusions: . sodium chloride 75 mL/hr at 08/13/16 1812   PRN Meds:.acetaminophen **OR** acetaminophen, carbamazepine, haloperidol lactate, LORazepam, ondansetron **OR** ondansetron (ZOFRAN) IV, QUEtiapine  Micro Results Recent Results (from the past 240 hour(s))  Urine culture     Status: Abnormal (Preliminary result)   Collection Time: 08/13/16  3:51 PM  Result Value Ref Range Status   Specimen Description URINE, RANDOM  Final   Special Requests NONE  Final   Culture >=100,000 COLONIES/mL GRAM NEGATIVE RODS (A)  Final   Report Status PENDING  Incomplete    Radiology Reports No results found.  Time Spent in minutes  25   Eddie North M.D on 08/14/2016 at 1:26 PM  Between 7am to 7pm - Pager - [phone]  After 7pm go to [email or web address] - password [facility]  [facility] -  Office  [phone]

## 2016-08-14 NOTE — Evaluation (Signed)
Physical Therapy Evaluation Patient Details Name: Jodi Beltran MRN: [id number] DOB: 03-10-1921 Today's Date: 08/14/2016   History of Present Illness  Patient is a 83 y.o female with hx of A-fib and advanced dementia presents with confusion. Found to have UTI.  Clinical Impression  Patient presents with baseline dementia, generalized weakness due to immobility, impaired balance and impaired mobility s/p above. Pt has 24/7 caregivers during the day and stays with son at night. Uses RW for ambulation. Pt requires Min-Mod A for transfers and ambulation at this time due to weakness. Son reports caregiver can assist with mobility at home. Will need to negotiate steps to get into son's home. Encouraged walking to bathroom with nursing to improve strength. Will follow acutely to maximize independence and mobility prior to return home.     Follow Up Recommendations No PT follow up;Supervision/Assistance - 24 hour;Supervision for mobility/OOB    Equipment Recommendations  None recommended by PT    Recommendations for Other Services       Precautions / Restrictions Precautions Precautions: Fall Restrictions Weight Bearing Restrictions: No      Mobility  Bed Mobility Overal bed mobility: Needs Assistance Bed Mobility: Supine to Sit     Supine to sit: Mod assist;HOB elevated     General bed mobility comments: Assist to elevate trunk to get to EOB. + dizziness.  Transfers Overall transfer level: Needs assistance Equipment used: Rolling walker (2 wheeled) Transfers: Sit to/from Stand Sit to Stand: Mod assist         General transfer comment: Assist to power to standing with multiple attempts due to weakness. Transferred to chair post ambulation.  Ambulation/Gait Ambulation/Gait assistance: Min assist Ambulation Distance (Feet): 20 Feet Assistive device: Rolling walker (2 wheeled) Gait Pattern/deviations: Step-through pattern;Decreased stride length;Trunk flexed Gait velocity:  decreased   General Gait Details: SLow, unsteady gait with Min A for balance/safety.   Stairs            Wheelchair Mobility    Modified Rankin (Stroke Patients Only)       Balance Overall balance assessment: Needs assistance Sitting-balance support: Feet supported;No upper extremity supported Sitting balance-Leahy Scale: Fair     Standing balance support: During functional activity;Bilateral upper extremity supported Standing balance-Leahy Scale: Poor Standing balance comment: Reilant on BUEs for support in standing.                             Pertinent Vitals/Pain Pain Assessment: No/denies pain    Home Living Family/patient expects to be discharged to:: Private residence Living Arrangements: Children;Non-relatives/Friends (caregiver) Available Help at Discharge: Available 24 hours/day;Family;Personal care attendant (caregiver during the day and son at night) Type of Home: House Home Access: Stairs to enter Entrance Stairs-Rails: Right Entrance Stairs-Number of Steps: 2 Home Layout: One level Home Equipment: Walker - 2 wheels;Cane - single point;Wheelchair - manual;Tub bench      Prior Function Level of Independence: Needs assistance   Gait / Transfers Assistance Needed: walks with walker  ADL's / Homemaking Assistance Needed: assist with bathing, dressing        Hand Dominance   Dominant Hand: Right    Extremity/Trunk Assessment   Upper Extremity Assessment Upper Extremity Assessment: Defer to OT evaluation    Lower Extremity Assessment Lower Extremity Assessment: Generalized weakness    Cervical / Trunk Assessment Cervical / Trunk Assessment: Kyphotic  Communication   Communication: HOH  Cognition Arousal/Alertness: Awake/alert Behavior During Therapy: WFL for tasks assessed/performed  Overall Cognitive Status: History of cognitive impairments - at baseline                      General Comments General comments (skin  integrity, edema, etc.): Son present during session and provided PLOF/history.    Exercises     Assessment/Plan    PT Assessment Patient needs continued PT services  PT Problem List Decreased strength;Decreased mobility;Decreased activity tolerance;Decreased balance;Decreased cognition;Decreased knowledge of use of DME          PT Treatment Interventions Therapeutic activities;Gait training;Therapeutic exercise;Patient/family education;Balance training;Functional mobility training;Stair training    PT Goals (Current goals can be found in the Care Plan section)  Acute Rehab PT Goals Patient Stated Goal: to get pt back home PT Goal Formulation: With family Time For Goal Achievement: 08/28/16 Potential to Achieve Goals: Good    Frequency Min 3X/week   Barriers to discharge        Co-evaluation               End of Session Equipment Utilized During Treatment: Gait belt Activity Tolerance: Patient tolerated treatment well Patient left: in chair;with call bell/phone within reach;with family/visitor present Nurse Communication: Mobility status         Time: [id number] PT Time Calculation (min) (ACUTE ONLY): 26 min   Charges:   PT Evaluation $PT Eval Low Complexity: 1 Procedure PT Treatments $Gait Training: 8-22 mins   PT G Codes:        Efe Fazzino A [place] 08/14/2016, 4:50 PM Mylo RedShauna Witten Certain, PT, DPT [phone]

## 2016-08-14 NOTE — Care Management Note (Signed)
Case Management Note  Patient Details  Name: Jodi Beltran MRN: [id number] Date of Birth: October 24, 1920  Subjective/Objective:                 Spoke with patient's son Felicity Pellegrinied at bedside. He states that the patient lives with him, a caregiver provides support through out the day. Caregiver takes patient next door (her house) then brings back home to [name]'s house for dinner and bedtime. Patient has 24 hour support and supervision. Felicity Pellegrinied states that they have multiples of DME at both residences. Also states that routine and the familiarity of her caregiver is important to her, and he declined additional HH care.    Action/Plan:  No further CM needs identified.  Expected Discharge Date:                  Expected Discharge Plan:  Home/Self Care  In-House Referral:     Discharge planning Services  CM Consult  Post Acute Care Choice:    Choice offered to:     DME Arranged:    DME Agency:     HH Arranged:    HH Agency:     Status of Service:  Completed, signed off  If discussed at [employer] Length of Stay Meetings, dates discussed:    Additional Comments:  Lawerance SabalDebbie Shalonda Sachse, RN 08/14/2016, 1:52 PM

## 2016-08-15 DIAGNOSIS — R410 Disorientation, unspecified: Secondary | ICD-10-CM

## 2016-08-15 DIAGNOSIS — N3000 Acute cystitis without hematuria: Principal | ICD-10-CM

## 2016-08-15 DIAGNOSIS — B9620 Unspecified Escherichia coli [E. coli] as the cause of diseases classified elsewhere: Secondary | ICD-10-CM | POA: Diagnosis present

## 2016-08-15 DIAGNOSIS — I10 Essential (primary) hypertension: Secondary | ICD-10-CM

## 2016-08-15 DIAGNOSIS — N390 Urinary tract infection, site not specified: Secondary | ICD-10-CM

## 2016-08-15 LAB — URINE CULTURE

## 2016-08-15 MED ORDER — HYDRALAZINE HCL 20 MG/ML IJ SOLN
10.0000 mg | Freq: Four times a day (QID) | INTRAMUSCULAR | Status: DC | PRN
  Administered 2016-08-15: 10 mg via INTRAVENOUS
  Filled 2016-08-15 (×2): qty 1

## 2016-08-15 MED ORDER — AMPICILLIN 500 MG PO CAPS
500.0000 mg | ORAL_CAPSULE | Freq: Three times a day (TID) | ORAL | Status: DC
  Filled 2016-08-15: qty 1

## 2016-08-15 MED ORDER — AMPICILLIN 500 MG PO CAPS: 500.0000 mg | ORAL_CAPSULE | Freq: Three times a day (TID) | ORAL | 0 refills | Status: AC

## 2016-08-15 NOTE — Progress Notes (Signed)
Jodi Beltran to be D/C'd Home per MD order.  Discussed with the patient and all questions fully answered.  VSS, Skin clean, dry and intact without evidence of skin break down, no evidence of skin tears noted. IV catheter discontinued intact. Site without signs and symptoms of complications. Dressing and pressure applied.  An After Visit Summary was printed and given to the patient. Patient received prescription.  D/c education completed with patient/family including follow up instructions, medication list, d/c activities limitations if indicated, with other d/c instructions as indicated by MD - patient able to verbalize understanding, all questions fully answered.   Patient instructed to return to ED, call 911, or call MD for any changes in condition.   Patient escorted via WC, and D/C home via private auto.  Jodi Beltran 08/15/2016 12:41 PM

## 2016-08-15 NOTE — Progress Notes (Signed)
PT Cancellation Note  Patient Details Name: Jodi Beltran S Nurse MRN: [id number] DOB: 05-07-1921   Cancelled Treatment:    Reason Eval/Treat Not Completed: Other (comment). Pt discharging home today. Son declining PT Rx prior to d/c.   Ilda FoilGarrow, Bronwyn Belasco Rene 08/15/2016, 11:30 AM

## 2016-08-15 NOTE — Discharge Summary (Signed)
Physician Discharge Summary  Jodi Beltran WUJ:[id number] DOB: [birth year] DOA: 08/13/2016  PCP: Garlan Fillers, MD  Admit date: 08/13/2016 Discharge date: 08/15/2016  Admitted From: Home Disposition:  Home  Recommendations for Outpatient Follow-up:  1. Follow up with PCP in 1-2 weeks 2. Patient was computed total seven-day course of antibiotics on 1/31.  Home Health: None Equipment/Devices: None  Discharge Condition: Stable CODE STATUS: DO NOT RESUSCITATE Diet recommendation: Regular    Discharge Diagnoses:  Principal Problem:   E-coli UTI   Active Problems:     Urinary tract infection   Acute metabolic encephalopathy   Tic douloureux   Alzheimer's disease   Hypertension   Atrial fibrillation with rapid ventricular response (HCC)   Brief narrative/history of present illness 83 y.o.femalewith medical history significant of advanced dementia, tic douloureux, presenting w/ 1+ days of worsening confusion and increased anxiety w/ visual hallucinations. She was recently treated with a course of Macrobid one week prior to hospitalization for UTI. Family informed that she has mental status changes with UTI in the past. At baseline she is mostly independent with her ADLs. In the ED vitals were stable. WBC elevated (14 K with predominant neutrophils) with UA suggestive of UTI and was admitted to hospitalist service. She also had transient rapid A. fib on admission.   Hospital course Principal problem Acute metabolic encephalopathy secondary to UTI Received Empiric Unasyn. Urine culture growing pansensitive Escherichia coli. Switch to oral ampicillin to complete a seven-day course of antibiotics. Encephalopathy has resolved and is hemodynamically stable to be discharged home.  A. fib with RVR Suspect secondary to UTI. Not a candidate for anticoagulation. Resolved after a dose of IV metoprolol in the ED. has since remained in sinus rhythm. Continue home dose  metoprolol.  Tic douloureux:  - Continue Tegretol  Hypertension: - Blood pressure mildly elevated. Continue metoprolol. Adjust medications if continues to be elevated during outpatient follow-up.  Alzheimer's Dementia, moderate Continue Namenda and Aricept      Family Communication  : Son at bedside  Disposition Plan  : Home   Consults  :  None  Procedures  : None  Discharge Instructions   Allergies as of 08/15/2016      Reactions   Ceftriaxone Other (See Comments)   Causes pt to become anxious and more delirious. Wont sleep   Other Other (See Comments)   Maple tree pollen - reaction unknown      Medication List    TAKE these medications   ALPRAZolam 0.25 MG tablet Commonly known as:  XANAX Take 1 tablet (0.25 mg total) by mouth at bedtime as needed for anxiety.   ampicillin 500 MG capsule Commonly known as:  PRINCIPEN Take 1 capsule (500 mg total) by mouth every 8 (eight) hours.   aspirin EC 325 MG tablet Take 1 tablet (325 mg total) by mouth daily.   carbamazepine 100 MG chewable tablet Commonly known as:  TEGRETOL Chew 1 tablet (100 mg total) by mouth 4 (four) times daily as needed (pain). What changed:  when to take this  additional instructions   donepezil 10 MG tablet Commonly known as:  ARICEPT Take 1 tablet (10 mg total) by mouth every morning.   memantine 10 MG tablet Commonly known as:  NAMENDA Take 1 tablet (10 mg total) by mouth 2 (two) times daily.   metoprolol succinate 25 MG 24 hr tablet Commonly known as:  TOPROL-XL Take 1 tablet (25 mg total) by mouth daily.   multivitamin with minerals tablet Take  1 tablet by mouth daily.   QUEtiapine 25 MG tablet Commonly known as:  SEROQUEL Take 0.5 tablets (12.5 mg total) by mouth at bedtime. What changed:  when to take this  reasons to take this   SALONPAS Pads Apply 1 each topically daily as needed (pain).      Follow-up Information    Garlan Fillers, MD.  Schedule an appointment as soon as possible for a visit in 1 week(s).   Specialty:  Internal Medicine Contact information: [address] [place] [address] [phone]          Allergies  Allergen Reactions  . Ceftriaxone Other (See Comments)    Causes pt to become anxious and more delirious. Wont sleep  . Other Other (See Comments)    Maple tree pollen - reaction unknown     Procedures/Studies:  No results found.    Subjective: Oriented back to baseline.  Discharge Exam: Vitals:   08/15/16 0813 08/15/16 0821  BP: (!) 178/55 (!) 171/56  Pulse:    Resp:    Temp:     Vitals:   08/15/16 0547 08/15/16 0730 08/15/16 0813 08/15/16 0821  BP: (!) 188/75 (!) 182/54 (!) 178/55 (!) 171/56  Pulse: 81     Resp: 18     Temp: 98.4 F (36.9 C)     TempSrc:      SpO2: 99%     Weight:      Height:          Gen: not in distress,  HEENT:  moist mucosa, supple neck Chest: clear b/l, no added sounds CVS: N S1&S2, no murmurs, rubs or gallop GI: soft, NT, ND,  Musculoskeletal: warm, no edema CNS: AAOX2-3    The results of significant diagnostics from this hospitalization (including imaging, microbiology, ancillary and laboratory) are listed below for reference.     Microbiology: Recent Results (from the past 240 hour(s))  Urine culture     Status: Abnormal   Collection Time: 08/13/16  3:51 PM  Result Value Ref Range Status   Specimen Description URINE, RANDOM  Final   Special Requests NONE  Final   Culture >=100,000 COLONIES/mL ESCHERICHIA COLI (A)  Final   Report Status 08/15/2016 FINAL  Final   Organism ID, Bacteria ESCHERICHIA COLI (A)  Final      Susceptibility   Escherichia coli - MIC*    AMPICILLIN <=2 SENSITIVE Sensitive     CEFAZOLIN <=4 SENSITIVE Sensitive     CEFTRIAXONE <=1 SENSITIVE Sensitive     CIPROFLOXACIN <=0.25 SENSITIVE Sensitive     GENTAMICIN <=1 SENSITIVE Sensitive     IMIPENEM <=0.25 SENSITIVE Sensitive     NITROFURANTOIN <=16  SENSITIVE Sensitive     TRIMETH/SULFA <=20 SENSITIVE Sensitive     AMPICILLIN/SULBACTAM <=2 SENSITIVE Sensitive     PIP/TAZO <=4 SENSITIVE Sensitive     Extended ESBL NEGATIVE Sensitive     * >=100,000 COLONIES/mL ESCHERICHIA COLI     Labs: BNP (last 3 results) No results for input(s): BNP in the last 8760 hours. Basic Metabolic Panel:  Recent Labs Lab 08/13/16 0300 08/14/16 0657  NA 140 142  K 3.5 3.6  CL 104 113*  CO2 22 23  GLUCOSE 108* 95  BUN 37* 27*  CREATININE 1.40* 0.93  CALCIUM 9.4 8.3*   Liver Function Tests:  Recent Labs Lab 08/13/16 0300 08/14/16 0657  AST 22 18  ALT 12* 11*  ALKPHOS 118 88  BILITOT 0.6 0.6  PROT 7.1 5.4*  ALBUMIN 4.0  2.7*   No results for input(s): LIPASE, AMYLASE in the last 168 hours. No results for input(s): AMMONIA in the last 168 hours. CBC:  Recent Labs Lab 08/13/16 0300 08/14/16 0657  WBC 14.1* 6.4  NEUTROABS 11.0*  --   HGB 12.5 10.9*  HCT 37.2 33.9*  MCV 92.8 95.2  PLT 363 257   Cardiac Enzymes:  Recent Labs Lab 08/13/16 1034  TROPONINI 0.03*   BNP: Invalid input(s): POCBNP CBG: No results for input(s): GLUCAP in the last 168 hours. D-Dimer No results for input(s): DDIMER in the last 72 hours. Hgb A1c No results for input(s): HGBA1C in the last 72 hours. Lipid Profile No results for input(s): CHOL, HDL, LDLCALC, TRIG, CHOLHDL, LDLDIRECT in the last 72 hours. Thyroid function studies No results for input(s): TSH, T4TOTAL, T3FREE, THYROIDAB in the last 72 hours.  Invalid input(s): FREET3 Anemia work up No results for input(s): VITAMINB12, FOLATE, FERRITIN, TIBC, IRON, RETICCTPCT in the last 72 hours. Urinalysis    Component Value Date/Time   COLORURINE YELLOW 08/13/2016 0320   APPEARANCEUR HAZY (A) 08/13/2016 0320   LABSPEC 1.004 (L) 08/13/2016 0320   PHURINE 6.0 08/13/2016 0320   GLUCOSEU NEGATIVE 08/13/2016 0320   HGBUR SMALL (A) 08/13/2016 0320   BILIRUBINUR NEGATIVE 08/13/2016 0320    KETONESUR NEGATIVE 08/13/2016 0320   PROTEINUR NEGATIVE 08/13/2016 0320   UROBILINOGEN 0.2 07/11/2013 1614   NITRITE POSITIVE (A) 08/13/2016 0320   LEUKOCYTESUR LARGE (A) 08/13/2016 0320   Sepsis Labs Invalid input(s): PROCALCITONIN,  WBC,  LACTICIDVEN Microbiology Recent Results (from the past 240 hour(s))  Urine culture     Status: Abnormal   Collection Time: 08/13/16  3:51 PM  Result Value Ref Range Status   Specimen Description URINE, RANDOM  Final   Special Requests NONE  Final   Culture >=100,000 COLONIES/mL ESCHERICHIA COLI (A)  Final   Report Status 08/15/2016 FINAL  Final   Organism ID, Bacteria ESCHERICHIA COLI (A)  Final      Susceptibility   Escherichia coli - MIC*    AMPICILLIN <=2 SENSITIVE Sensitive     CEFAZOLIN <=4 SENSITIVE Sensitive     CEFTRIAXONE <=1 SENSITIVE Sensitive     CIPROFLOXACIN <=0.25 SENSITIVE Sensitive     GENTAMICIN <=1 SENSITIVE Sensitive     IMIPENEM <=0.25 SENSITIVE Sensitive     NITROFURANTOIN <=16 SENSITIVE Sensitive     TRIMETH/SULFA <=20 SENSITIVE Sensitive     AMPICILLIN/SULBACTAM <=2 SENSITIVE Sensitive     PIP/TAZO <=4 SENSITIVE Sensitive     Extended ESBL NEGATIVE Sensitive     * >=100,000 COLONIES/mL ESCHERICHIA COLI     Time coordinating discharge: Over 30 minutes  SIGNED:   Eddie NorthHUNGEL, Charizma Gardiner, MD  [facility] 08/15/2016, 10:41 AM Pager   If 7PM-7AM, please contact night-coverage [email or web address] Password [email or web address]

## 2016-09-24 ENCOUNTER — Encounter: Payer: Self-pay | Admitting: Neurology

## 2016-09-24 ENCOUNTER — Ambulatory Visit (INDEPENDENT_AMBULATORY_CARE_PROVIDER_SITE_OTHER): Payer: Medicare Other | Admitting: Neurology

## 2016-09-24 VITALS — BP 173/76 | HR 72 | Ht 68.0 in | Wt 124.0 lb

## 2016-09-24 DIAGNOSIS — G500 Trigeminal neuralgia: Secondary | ICD-10-CM | POA: Diagnosis not present

## 2016-09-24 DIAGNOSIS — F0280 Dementia in other diseases classified elsewhere without behavioral disturbance: Secondary | ICD-10-CM | POA: Diagnosis not present

## 2016-09-24 DIAGNOSIS — G308 Other Alzheimer's disease: Secondary | ICD-10-CM

## 2016-09-24 DIAGNOSIS — G513 Clonic hemifacial spasm: Secondary | ICD-10-CM

## 2016-09-24 DIAGNOSIS — G5139 Clonic hemifacial spasm, unspecified: Secondary | ICD-10-CM

## 2016-09-24 MED ORDER — ALPRAZOLAM 0.25 MG PO TABS: 0.2500 mg | ORAL_TABLET | Freq: Every evening | ORAL | 0 refills | Status: DC | PRN

## 2016-09-24 NOTE — Progress Notes (Signed)
Chief Complaint  Patient presents with  . Alzheimer's Disease    MMSE 20/30 - 6 animals.  She is here with her son, Jodi Beltran and her caregiver, Jodi Beltran.  Feels memory is stable.  She has good and bad days.   . Tix Douloureux    She has continued to do well on Tegretol.  She has been able to cut her dose down to 50mg , daily.      [facility]  PATIENT: Jodi Beltran DOB: 03-Feb-1921  HISTORICAL  Jodi Beltran is a 83 years old right-handed Caucasian female, referred by her primary care physician, accompanied by her son, and caregiver at today's visit, she was patient of Dr. love, last clinical visit was in November 2013  She had a past medical history of right trigeminal neuralgia, symptoms started in 1995, first evaluated in 1996, has been taking Tegretol 100 mg daily, sometimes extra 100 mg tablet, she is doing very well, there was no recurrent right facial pain. She also has gradual onset mild to moderate memory trouble, she has CNA 7 days a week, she goes to her son's house for night time.  MRI of the brain in 2007 showed cerebral and pontine small vessel disease.  She has no recurrent left facial pain doing very well, just want to have her medicine refilled.   UPDATE March 4th 2016: Last visit was in 04/2013, she lives at home, accompanied by her son and care give at today's visit.  She is getting more forgetful, today MMSE 21/30, not oriented to time and place, missed 3/3 recalls,  She denies significant facial pain, has been on Tegretol 100 mg 1 tablets daily for many years, extra doses as needed, had a history of right hip fracture require surgical fixation,  UPDATE March 7th 2017: She has not had recurrent right trigeminal neuralgia, she is now only taking tegretol 100mg  qday,  She was noted to have right facial pain by holding her right face, grimace of her right face, usually one extra tegretol would help.  She was admitted to the hospital February 20 fifth 220 eighth  2017 for , she was noted to have new onset atrial fibrillation, was treated with metoprolol, she also had increased confusion during her hospital stay, she was treated with antibiotic Rocephin, now is back to her baseline, continue Aricept, and Namenda  Her memory overall has mild progression,  She can still carry on conversation, she can still dress herself, feed,  sleeps well, she has forceful left eye closing  UPDATE March 7th 2018: She has sitter with her 24x7, she has good appetite, sleeps well, mild chronic constipation, only occasionally recurrent trigeminal pain, taking half tablets of Tegretol 200 mg,  She also take Xanax 0.25 milligrams, occasionally seroquel 25 mg half tablet during UTI, and delirium.  REVIEW OF SYSTEMS: Full 14 system review of systems performed and notable only for incontinence of bladder, memory loss  ALLERGIES: Allergies  Allergen Reactions  . Ceftriaxone Other (See Comments)    Causes pt to become anxious and more delirious. Wont sleep  . Other Other (See Comments)    Maple tree pollen - reaction unknown    HOME MEDICATIONS: Outpatient Medications Prior to Visit  Medication Sig Dispense Refill  . ALPRAZolam (XANAX) 0.25 MG tablet Take 1 tablet (0.25 mg total) by mouth at bedtime as needed for anxiety. 10 tablet 0  . aspirin EC 325 MG tablet Take 1 tablet (325 mg total) by mouth daily. 30 tablet 0  .  carbamazepine (TEGRETOL) 100 MG chewable tablet Chew 1 tablet (100 mg total) by mouth 4 (four) times daily as needed (pain). (Patient taking differently: Chew 100 mg by mouth See admin instructions. Take 1 tablet daily. Can take another tablet as needed for pain) 360 tablet 4  . donepezil (ARICEPT) 10 MG tablet Take 1 tablet (10 mg total) by mouth every morning. 90 tablet 4  . memantine (NAMENDA) 10 MG tablet Take 1 tablet (10 mg total) by mouth 2 (two) times daily. 180 tablet 4  . metoprolol succinate (TOPROL-XL) 25 MG 24 hr tablet Take 1 tablet (25 mg total)  by mouth daily. 30 tablet 0  . Multiple Vitamins-Minerals (MULTIVITAMIN WITH MINERALS) tablet Take 1 tablet by mouth daily.    . QUEtiapine (SEROQUEL) 25 MG tablet Take 0.5 tablets (12.5 mg total) by mouth at bedtime. (Patient taking differently: Take 12.5 mg by mouth at bedtime as needed (anxiety). ) 15 tablet 0  . Liniments (SALONPAS) PADS Apply 1 each topically daily as needed (pain).     No facility-administered medications prior to visit.     PAST MEDICAL HISTORY: Past Medical History:  Diagnosis Date  . Alzheimer disease   . Arthritis   . Atrial fibrillation (HCC) 07/2016  . Dementia   . Tic douloureux   . Tic douloureux   . UTI (urinary tract infection) 07/2016    PAST SURGICAL HISTORY: Past Surgical History:  Procedure Laterality Date  . ABDOMINAL HYSTERECTOMY    . BACK SURGERY    . bladder tact    . HIP PINNING,CANNULATED Right 01/05/2013   Procedure: CANNULATED HIP PINNING RIGHT HIP;  Surgeon: Venita Lick, MD;  Location: [facility] OR;  Service: Orthopedics;  Laterality: Right;  . tumors removal benign      FAMILY HISTORY: Family History  Problem Relation Age of Onset  . Lung cancer Mother     SOCIAL HISTORY:  Social History   Social History  . Marital status: Widowed    Spouse name: N/A  . Number of children: 2  . Years of education: 9th   Occupational History  .  Retired    Retired   Social History Main Topics  . Smoking status: Never Smoker  . Smokeless tobacco: Never Used  . Alcohol use No  . Drug use: No  . Sexual activity: No   Other Topics Concern  . Not on file   Social History Narrative   Patient goes back and fourth between sons house and hers.   Retired.   Education. 9 th grade education.   Right handed.   Caffeine- two cups daily.    Patient is  a widow.    PHYSICAL EXAM   Vitals:   09/24/16 1441  BP: (!) 173/76  Pulse: 72  Weight: 124 lb (56.2 kg)  Height: 5\' 8"  (1.727 m)    Body mass index is 18.85 kg/m.  PHYSICAL  EXAMNIATION:  Gen: NAD, conversant, well nourised, obese, well groomed                     Cardiovascular: Regular rate rhythm, no peripheral edema, warm, nontender. Eyes: Conjunctivae clear without exudates or hemorrhage Neck: Supple, no carotid bruise. Pulmonary: Clear to auscultation bilaterally   NEUROLOGICAL EXAM:  MENTAL STATUS: Speech:    Speech is normal; fluent and spontaneous with normal comprehension.  Cognition: MMSE 20 /30, animal naming 6     Orientation to time, place and person:    Recent and remote memory: missed  3/3 recall     Normal Attention span and concentration     Normal Language, naming, repeating,spontaneous speech: difficulty follow 3 steps command, difficulty copy design     Fund of knowledge     CRANIAL NERVES: CN II: Visual fields are full to confrontation. Fundoscopic exam is normal with sharp discs and no vascular changes. Venous pulsations are present bilaterally. Pupils are 4 mm and briskly reactive to light.  CN III, IV, VI: extraocular movement are normal. No ptosis. CN V: Facial sensation is intact to pinprick in all 3 divisions bilaterally. Corneal responses are intact. She has frequent forceful left eye closing, with intermittent left cheek muscle spasm  CN VII: Face is symmetric with normal eye closure and smile. She has a tendency to close her left eye CN VIII: Hearing is normal to rubbing fingers CN IX, X: Palate elevates symmetrically. Phonation is normal. CN XI: Head turning and shoulder shrug are intact CN XII: Tongue is midline with normal movements and no atrophy.  MOTOR: There is no pronator drift of out-stretched arms. Muscle bulk and tone are normal. Muscle strength is normal.  REFLEXES: Reflexes are 2+ and symmetric at the biceps, triceps, knees, and ankles. Plantar responses are flexor.  SENSORY: Light touch, pinprick, position sense, and vibration sense are intact in fingers and toes.  COORDINATION: Rapid alternating  movements and fine finger movements are intact. There is no dysmetria on finger-to-nose and heel-knee-shin. There are no abnormal or extraneous movements.   GAIT/STANCE: She needs to push up to get up from seated position, steady   DIAGNOSTIC DATA (LABS, IMAGING, TESTING) - I reviewed patient records, labs, notes, testing and imaging myself where available.  Lab Results  Component Value Date   WBC 6.4 08/14/2016   HGB 10.9 (L) 08/14/2016   HCT 33.9 (L) 08/14/2016   MCV 95.2 08/14/2016   PLT 257 08/14/2016      Component Value Date/Time   NA 142 08/14/2016 0657   K 3.6 08/14/2016 0657   CL 113 (H) 08/14/2016 0657   CO2 23 08/14/2016 0657   GLUCOSE 95 08/14/2016 0657   BUN 27 (H) 08/14/2016 0657   CREATININE 0.93 08/14/2016 0657   CALCIUM 8.3 (L) 08/14/2016 0657   PROT 5.4 (L) 08/14/2016 0657   ALBUMIN 2.7 (L) 08/14/2016 0657   AST 18 08/14/2016 0657   ALT 11 (L) 08/14/2016 0657   ALKPHOS 88 08/14/2016 0657   BILITOT 0.6 08/14/2016 0657   GFRNONAA 51 (L) 08/14/2016 0657   GFRAA 60 (L) 08/14/2016 0657    ASSESSMENT AND PLAN 83 years old Caucasian female, with past medical history of left trigeminal neuralgia, dementia, doing very well  Trigeminal neuralgia  doing well on low dose Tegretol,  Previously we have tried Neurontin she could not tolerate the side effect to 100 mg 3 times a day, call office for recurrent facial pain Dementia,  Mini-Mental Status Examination is 20 out of 30 today,   keep current dose of Aricept, Namenda  Xanax, seroquel as needed  Left hemifacial spasm, forceful left eye closing     Levert FeinsteinYijun Novalynn Branaman, M.D. Ph.D.  [facility] [address], [address] [place], [place] [address] [phone]

## 2016-09-25 ENCOUNTER — Other Ambulatory Visit: Payer: Self-pay | Admitting: Neurology

## 2016-09-25 DIAGNOSIS — G500 Trigeminal neuralgia: Secondary | ICD-10-CM

## 2016-10-09 ENCOUNTER — Encounter (HOSPITAL_COMMUNITY): Payer: Self-pay

## 2016-10-09 ENCOUNTER — Telehealth: Payer: Self-pay | Admitting: Neurology

## 2016-10-09 ENCOUNTER — Emergency Department (HOSPITAL_COMMUNITY)
Admission: EM | Admit: 2016-10-09 | Discharge: 2016-10-09 | Disposition: A | Discharge status: Home / Self Care | Payer: Medicare Other | Attending: Emergency Medicine | Admitting: Emergency Medicine

## 2016-10-09 ENCOUNTER — Emergency Department (HOSPITAL_COMMUNITY): Payer: Medicare Other

## 2016-10-09 DIAGNOSIS — I10 Essential (primary) hypertension: Secondary | ICD-10-CM | POA: Diagnosis not present

## 2016-10-09 DIAGNOSIS — Z7982 Long term (current) use of aspirin: Secondary | ICD-10-CM | POA: Diagnosis not present

## 2016-10-09 DIAGNOSIS — G309 Alzheimer's disease, unspecified: Secondary | ICD-10-CM | POA: Insufficient documentation

## 2016-10-09 DIAGNOSIS — Z79899 Other long term (current) drug therapy: Secondary | ICD-10-CM | POA: Diagnosis not present

## 2016-10-09 DIAGNOSIS — R410 Disorientation, unspecified: Secondary | ICD-10-CM | POA: Diagnosis not present

## 2016-10-09 DIAGNOSIS — F0280 Dementia in other diseases classified elsewhere without behavioral disturbance: Secondary | ICD-10-CM | POA: Diagnosis not present

## 2016-10-09 DIAGNOSIS — R4182 Altered mental status, unspecified: Secondary | ICD-10-CM | POA: Diagnosis present

## 2016-10-09 LAB — URINALYSIS, ROUTINE W REFLEX MICROSCOPIC
Bilirubin Urine: NEGATIVE
Glucose, UA: NEGATIVE mg/dL
HGB URINE DIPSTICK: NEGATIVE
KETONES UR: 5 mg/dL — AB
LEUKOCYTES UA: NEGATIVE
NITRITE: NEGATIVE
PROTEIN: NEGATIVE mg/dL
Specific Gravity, Urine: 1.006 (ref 1.005–1.030)
pH: 5 (ref 5.0–8.0)

## 2016-10-09 LAB — CBC
HCT: 35.5 % — ABNORMAL LOW (ref 36.0–46.0)
HEMOGLOBIN: 11.7 g/dL — AB (ref 12.0–15.0)
MCH: 30.9 pg (ref 26.0–34.0)
MCHC: 33 g/dL (ref 30.0–36.0)
MCV: 93.7 fL (ref 78.0–100.0)
Platelets: 312 10*3/uL (ref 150–400)
RBC: 3.79 MIL/uL — ABNORMAL LOW (ref 3.87–5.11)
RDW: 13.3 % (ref 11.5–15.5)
WBC: 11.3 10*3/uL — ABNORMAL HIGH (ref 4.0–10.5)

## 2016-10-09 LAB — COMPREHENSIVE METABOLIC PANEL
ALBUMIN: 3.7 g/dL (ref 3.5–5.0)
ALK PHOS: 113 U/L (ref 38–126)
ALT: 16 U/L (ref 14–54)
ANION GAP: 11 (ref 5–15)
AST: 23 U/L (ref 15–41)
BUN: 28 mg/dL — ABNORMAL HIGH (ref 6–20)
CHLORIDE: 107 mmol/L (ref 101–111)
CO2: 22 mmol/L (ref 22–32)
Calcium: 9.5 mg/dL (ref 8.9–10.3)
Creatinine, Ser: 1.58 mg/dL — ABNORMAL HIGH (ref 0.44–1.00)
GFR calc Af Amer: 31 mL/min — ABNORMAL LOW (ref >60)
GFR calc non Af Amer: 27 mL/min — ABNORMAL LOW (ref >60)
GLUCOSE: 89 mg/dL (ref 65–99)
Potassium: 4.7 mmol/L (ref 3.5–5.1)
SODIUM: 140 mmol/L (ref 135–145)
Total Bilirubin: 0.6 mg/dL (ref 0.3–1.2)
Total Protein: 7 g/dL (ref 6.5–8.1)

## 2016-10-09 LAB — CBG MONITORING, ED: GLUCOSE-CAPILLARY: 75 mg/dL (ref 65–99)

## 2016-10-09 MED ORDER — LORAZEPAM 1 MG PO TABS: 1.0000 mg | ORAL_TABLET | Freq: Three times a day (TID) | ORAL | 0 refills | Status: DC | PRN

## 2016-10-09 MED ORDER — QUETIAPINE FUMARATE 25 MG PO TABS: 25.0000 mg | ORAL_TABLET | Freq: Every day | ORAL | 5 refills | Status: DC

## 2016-10-09 MED ORDER — SODIUM CHLORIDE 0.9 % IV BOLUS (SEPSIS)
500.0000 mL | Freq: Once | INTRAVENOUS | Status: AC
  Administered 2016-10-09: 500 mL via INTRAVENOUS

## 2016-10-09 MED ORDER — ALPRAZOLAM 0.25 MG PO TABS: 0.2500 mg | ORAL_TABLET | Freq: Two times a day (BID) | ORAL | 5 refills | Status: DC | PRN

## 2016-10-09 MED ORDER — LORAZEPAM 2 MG/ML IJ SOLN
1.0000 mg | Freq: Once | INTRAMUSCULAR | Status: AC
  Administered 2016-10-09: 1 mg via INTRAVENOUS
  Filled 2016-10-09: qty 1

## 2016-10-09 NOTE — Telephone Encounter (Signed)
Levert FeinsteinYijun Yan, MD 1 hour ago (2:15 PM)      her son called office for increased confusion,  Increase seroquel 25 mg every night,  Increase water intake. Make sure she is not constipated.     Spoke to [employer] (son on HIPAA) while his mother is being prepared for discharge from the ED.  He says she has no problems with constipation.  He will make sure that she is drinking plenty of water.  In addition to the increase in Seroquel, Dr. Terrace ArabiaYan has provided them with a prescription for Xanax 0.25mg , one tab BID PRN.  He was agreeable to this plan and verbalized understanding.  Invited him to call back with any further concerns.

## 2016-10-09 NOTE — Telephone Encounter (Addendum)
Spoke to patient's son on HIPAA - states his mother is in a "paranoid frenzy".  She will not let her son or caregiver help.  She is trying to get out the house to get away from them.  States he has never seen her like this in the past. He has given her one tablet of lorazepam 0.25mg  hoping that would help them get better control of the situation.  She is now having uncontrollable crying fits.  She has record of a recent UTI.  Instructed him to take her to the ED for more immediate treatment.  He is in agreeable with this pan.

## 2016-10-09 NOTE — Discharge Instructions (Signed)
Ativan as prescribed as needed for agitation.  Follow-up with your primary doctor and neurologist in the next week to review your medications.

## 2016-10-09 NOTE — Telephone Encounter (Signed)
Pt son called to inform that Pt is in a paranoid frenzy and not sure of what to do is asking for a call as quickly as possible

## 2016-10-09 NOTE — ED Notes (Signed)
Pt upset by BP cuff, MD advises to remove BP cuff

## 2016-10-09 NOTE — ED Notes (Signed)
DC teaching with pt son and POA. Verbalizes understanding NAD.

## 2016-10-09 NOTE — ED Notes (Signed)
Pt's CBG result was 75. Informed Jamie - RN.

## 2016-10-09 NOTE — ED Triage Notes (Signed)
Per Caregiver, Pt was noted to have increase energy and disorientation x 1 week. PCP diagnosed with UTI one week ago and gave medication. Pt was increasingly getting better until last night when her energy, paranoia, and agitation increased. Hx of dementia. Caregiver gave patient 0.25 mg Xanax to help calm patient due to her irritation.

## 2016-10-09 NOTE — Telephone Encounter (Signed)
Pt's son called said she is going to be released from the hospital soon. He said she is still hallucinating. Call transferred to RN.

## 2016-10-09 NOTE — Telephone Encounter (Signed)
Spoke to her son, Felicity Pellegrinied - reviewed her medication changes with him again.  He verbalized understanding.  His mother was given Ativan in the ED and is at home sleeping now.

## 2016-10-09 NOTE — ED Notes (Signed)
ED Provider at bedside. 

## 2016-10-09 NOTE — Telephone Encounter (Signed)
Pt son called back for Marcelino DusterMichelle with a few more questions re: the medication for his mother.  I was able to connect the call

## 2016-10-09 NOTE — ED Provider Notes (Signed)
[facility]-EMERGENCY DEPT Provider Note   CSN: [id number] Arrival date & time: 10/09/16  [id number]     History   Chief Complaint Chief Complaint  Patient presents with  . Altered Mental Status    HPI Jodi Beltran is a 83 y.o. female.  Patient is a 83 year old female with past medical history of dementia. She presents for evaluation of mental status change. According to the son, she has been more confused and "chatty". He states that she gets this way when she has a UTI. She was recently treated for this by her primary Dr., however her condition has not improved. The patient denies to me any specific aches or pains.   The history is provided by the patient.  Altered Mental Status   This is a new problem. The current episode started 2 days ago. The problem has been gradually worsening. Associated symptoms include confusion. Her past medical history does not include CVA or hypertension.    Past Medical History:  Diagnosis Date  . Alzheimer disease   . Arthritis   . Atrial fibrillation (HCC) 07/2016  . Dementia   . Tic douloureux   . Tic douloureux   . UTI (urinary tract infection) 07/2016    Patient Active Problem List   Diagnosis Date Noted  . Hemifacial spasm 09/24/2016  . E-coli UTI 08/15/2016  . Urinary tract infection 08/13/2016  . Acute metabolic encephalopathy 08/13/2016  . Acute cystitis without hematuria   . Acute delirium   . Atrial fibrillation with RVR (HCC)   . UTI (urinary tract infection) 09/16/2015  . Atrial fibrillation with rapid ventricular response (HCC) 09/15/2015  . Atrial fibrillation with tachycardic ventricular rate (HCC) 09/15/2015  . Acute blood loss anemia 01/23/2013  . Constipation 01/23/2013  . Closed right hip fracture (HCC) 01/05/2013  . Hypertension 01/05/2013  . Hip fracture requiring operative repair (HCC) 01/05/2013  . Dehydration 09/20/2012    Class: Acute  . Acute pharyngitis 09/20/2012    Class: Acute  . Odynophagia 09/20/2012   Class: Acute  . Tic douloureux 09/20/2012    Class: Chronic  . Alzheimer's disease 09/20/2012    Class: Chronic  . Hearing loss 09/20/2012    Class: Chronic  . Microscopic hematuria 09/20/2012    Class: Chronic  . Low back pain 09/20/2012    Class: Chronic    Past Surgical History:  Procedure Laterality Date  . ABDOMINAL HYSTERECTOMY    . BACK SURGERY    . bladder tact    . HIP PINNING,CANNULATED Right 01/05/2013   Procedure: CANNULATED HIP PINNING RIGHT HIP;  Surgeon: Venita Lickahari Brooks, MD;  Location: [facility] OR;  Service: Orthopedics;  Laterality: Right;  . tumors removal benign      OB History    No data available       Home Medications    Prior to Admission medications   Medication Sig Start Date End Date Taking? Authorizing Provider  ALPRAZolam (XANAX) 0.25 MG tablet Take 1 tablet (0.25 mg total) by mouth at bedtime as needed for anxiety. 09/24/16   Levert FeinsteinYijun Yan, MD  aspirin EC 325 MG tablet Take 1 tablet (325 mg total) by mouth daily. 01/07/13   Venita Lickahari Brooks, MD  carbamazepine (TEGRETOL) 100 MG chewable tablet CHEW 1 TABLET FOUR TIMES A DAY AS NEEDED FOR PAIN 09/25/16   Levert FeinsteinYijun Yan, MD  donepezil (ARICEPT) 10 MG tablet Take 1 tablet (10 mg total) by mouth every morning. 09/25/15   Levert FeinsteinYijun Yan, MD  memantine (NAMENDA) 10 MG tablet Take  1 tablet (10 mg total) by mouth 2 (two) times daily. 09/25/15   Levert Feinstein, MD  metoprolol succinate (TOPROL-XL) 25 MG 24 hr tablet Take 1 tablet (25 mg total) by mouth daily. 09/18/15   Joseph Art, DO  Multiple Vitamins-Minerals (MULTIVITAMIN WITH MINERALS) tablet Take 1 tablet by mouth daily.    Historical Provider, MD  QUEtiapine (SEROQUEL) 25 MG tablet Take 0.5 tablets (12.5 mg total) by mouth at bedtime. Patient taking differently: Take 12.5 mg by mouth at bedtime as needed (anxiety).  09/18/15   Joseph Art, DO    Family History Family History  Problem Relation Age of Onset  . Lung cancer Mother     Social History Social History  Substance Use  Topics  . Smoking status: Never Smoker  . Smokeless tobacco: Never Used  . Alcohol use No     Allergies   Ceftriaxone and Other   Review of Systems Review of Systems  Psychiatric/Behavioral: Positive for confusion.  All other systems reviewed and are negative.    Physical Exam Updated Vital Signs BP (!) 127/51 (BP Location: Left Arm)   Pulse 96   Temp 98.9 F (37.2 C) (Oral)   Resp 16   Ht 5\' 7"  (1.702 m)   Wt 130 lb (59 kg)   SpO2 97%   BMI 20.36 kg/m   Physical Exam  Constitutional: She is oriented to person, place, and time. She appears well-developed and well-nourished. No distress.  Patient appears confused and is emotionally labile. She goes from being pleasant and smiling to tearful  HENT:  Head: Normocephalic and atraumatic.  Mouth/Throat: Oropharynx is clear and moist.  Eyes: EOM are normal. Pupils are equal, round, and reactive to light.  Neck: Normal range of motion. Neck supple.  Cardiovascular: Normal rate and regular rhythm.  Exam reveals no gallop and no friction rub.   No murmur heard. Pulmonary/Chest: Effort normal and breath sounds normal. No respiratory distress. She has no wheezes.  Abdominal: Soft. Bowel sounds are normal. She exhibits no distension. There is no tenderness.  Musculoskeletal: Normal range of motion.  Neurological: She is alert and oriented to person, place, and time. No cranial nerve deficit.  Skin: Skin is warm and dry. She is not diaphoretic.  Nursing note and vitals reviewed.    ED Treatments / Results  Labs (all labs ordered are listed, but only abnormal results are displayed) Labs Reviewed  COMPREHENSIVE METABOLIC PANEL - Abnormal; Notable for the following:       Result Value   BUN 28 (*)    Creatinine, Ser 1.58 (*)    GFR calc non Af Amer 27 (*)    GFR calc Af Amer 31 (*)    All other components within normal limits  CBC - Abnormal; Notable for the following:    WBC 11.3 (*)    RBC 3.79 (*)    Hemoglobin 11.7  (*)    HCT 35.5 (*)    All other components within normal limits  CBG MONITORING, ED    EKG  EKG Interpretation None       Radiology No results found.  Procedures Procedures (including critical care time)  Medications Ordered in ED Medications - No data to display   Initial Impression / Assessment and Plan / ED Course  I have reviewed the triage vital signs and the nursing notes.  Pertinent labs & imaging results that were available during my care of the patient were reviewed by me and considered in  my medical decision making (see chart for details).  Patient brought here by family members over concerns of confusion. She has a history of dementia, however this is been much worse over the past several weeks. She has been treated with antibiotics for a urinary tract infection. This seems to help her confusion, however when she goes off antibiotics she again becomes confused.  Today's workup reveals no acute abnormality. Her urine is clear, laboratory studies are essentially unremarkable with the exception of a slight bump in her creatinine over baseline. This could represent mild dehydration and she was given 500 mL of normal saline.  Head CT is negative and remainder of the workup is otherwise unremarkable. She has no specific complaints and otherwise appears well with the exception of being confused.  I had an extensive conversation with the patient's son and caretaker who is present at bedside. I also have spoken with Dr. Roxy Manns from neurology who does not feel as though any further workup is indicated. He is recommending additional sedatives during the day when the patient becomes confused. Patient's son also spoke with Dr. Glenna Durand who is their neurologist. She also agrees with additional sedative medication and giving this situation time.  I see no indication for admission and believe she is appropriate for discharge. There are follow-up with their neurologist and primary  doctor.  Final Clinical Impressions(s) / ED Diagnoses   Final diagnoses:  None    New Prescriptions New Prescriptions   No medications on file     Geoffery Lyons, MD 10/09/16 1550

## 2016-10-09 NOTE — Addendum Note (Signed)
Addended by: Lilla ShookKIRKMAN, Emet Rafanan C on: 10/09/2016 03:35 PM   Modules accepted: Orders

## 2016-10-09 NOTE — Telephone Encounter (Signed)
her son called office for increased confusion,  Increase seroquel 25 mg every night,  Increase water intake. Make sure she is not constipated.

## 2016-10-10 ENCOUNTER — Telehealth: Payer: Self-pay | Admitting: *Deleted

## 2016-10-10 NOTE — Telephone Encounter (Signed)
Spoke to Kinder Morgan EnergyJudy (dgt on HIPAA) - she wanted to review her mother's medication instructions on the Xanax and Seroquel.

## 2016-10-19 ENCOUNTER — Telehealth: Payer: Self-pay | Admitting: Neurology

## 2016-10-19 NOTE — Telephone Encounter (Signed)
Received call from patient`s son [name] about patient being agitated and nor responding to prescribed dose of seroquel and 2 doses of ativan 1 mg last night. I recommend increase sreoquel to 25 mg in am and at night. May use additional 12.5 mg at night if needed.I asked him to stop ativan.Advised to call back in 1-2 days with update. He voiced understanding

## 2016-10-21 ENCOUNTER — Encounter: Payer: Self-pay | Admitting: *Deleted

## 2016-10-21 NOTE — Telephone Encounter (Signed)
Patient's son calling. He spoke to Dr. Pearlean Brownie this past Sunday but would like a call back to discuss his mother's condition.

## 2016-10-21 NOTE — Telephone Encounter (Signed)
Spoke to son on HIPAA - states his mother is doing well with the medication changes made on 10/19/16.  She rested well last night and did not get up.

## 2016-11-06 ENCOUNTER — Other Ambulatory Visit: Payer: Self-pay | Admitting: *Deleted

## 2016-11-06 ENCOUNTER — Telehealth: Payer: Self-pay | Admitting: Neurology

## 2016-11-06 DIAGNOSIS — G500 Trigeminal neuralgia: Secondary | ICD-10-CM

## 2016-11-06 MED ORDER — QUETIAPINE FUMARATE 25 MG PO TABS: 25.0000 mg | ORAL_TABLET | Freq: Every day | ORAL | 0 refills | Status: AC

## 2016-11-06 MED ORDER — MEMANTINE HCL 10 MG PO TABS: 10.0000 mg | ORAL_TABLET | Freq: Two times a day (BID) | ORAL | 0 refills | Status: AC

## 2016-11-06 MED ORDER — DONEPEZIL HCL 10 MG PO TABS: 10.0000 mg | ORAL_TABLET | Freq: Every morning | ORAL | 0 refills | Status: AC

## 2016-11-06 MED ORDER — ALPRAZOLAM 0.25 MG PO TABS: 0.2500 mg | ORAL_TABLET | Freq: Two times a day (BID) | ORAL | 0 refills | Status: AC | PRN

## 2016-11-06 MED ORDER — CARBAMAZEPINE 100 MG PO CHEW: CHEWABLE_TABLET | ORAL | 0 refills | Status: AC

## 2016-11-06 NOTE — Telephone Encounter (Signed)
Patients son Oreta Soloway called office in reference to mother moving to an assisted living facility named [facility] in [place].  Son states the facility is needing prescriptions for ALPRAZolam (XANAX) 0.25 MG tablet, carbamazepine (TEGRETOL) 100 MG chewable tablet, donepezil (ARICEPT) 10 MG tablet, memantine (NAMENDA) 10 MG tablet, and QUEtiapine (SEROQUEL) 25 MG tablet and last 2 office visit notes faxed to [phone] ATTN: Margarite Gouge.  Patient will be moving 11/20/16.  Phone number for facility is [phone].

## 2016-11-06 NOTE — Telephone Encounter (Signed)
Pt being moved to [facility] (memory facility in [place]).  One month of medication faxed to facility at [phone].  New MD to take over management of medications.  Also, faxed office notes.  Received confirmation that fax was completed.

## 2016-12-18 ENCOUNTER — Other Ambulatory Visit: Payer: Self-pay | Admitting: Neurology

## 2017-10-01 ENCOUNTER — Ambulatory Visit: Payer: Medicare Other | Admitting: Neurology

## 2017-10-26 IMAGING — CT CT HEAD W/O CM
3 of 4 series · 18 of 47 positions shown, 21 images · non-contrast
Comparison: 09/15/2015

CLINICAL DATA: Altered mental status.

EXAM:
CT HEAD WITHOUT CONTRAST
TECHNIQUE: Contiguous axial images were obtained from the base of the skull
through the vertex without intravenous contrast.

[Series 201: head w/o, idose (1) · axial · non-contrast · 0.41mm/px · z∈[+195,+315]mm · 12 of 29 slices shown, 15 images]
[im 3/29  brain]
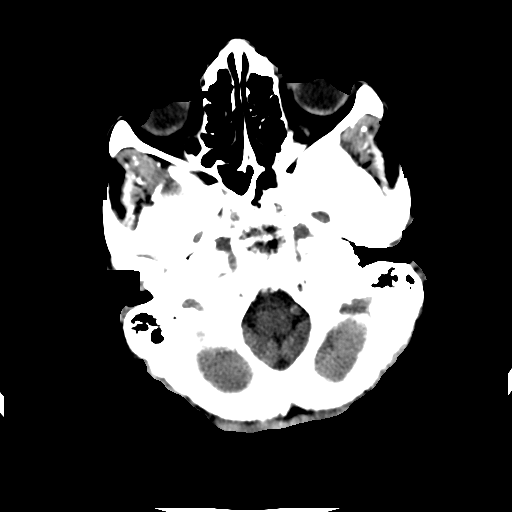
[im 3/29  bone]
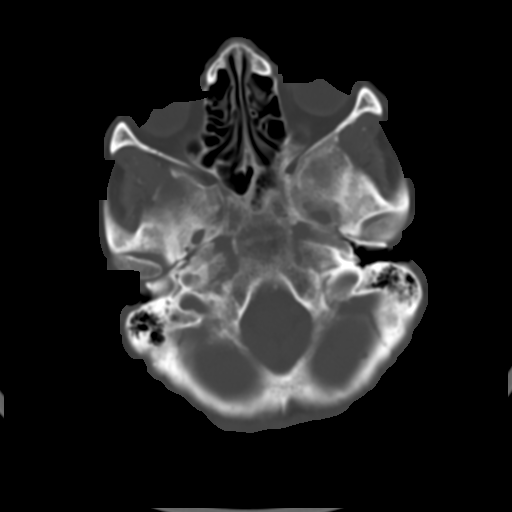
[im 5/29  brain]
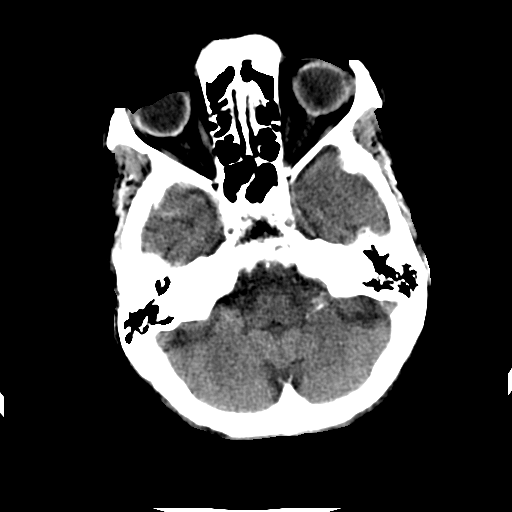
[im 7/29  brain]
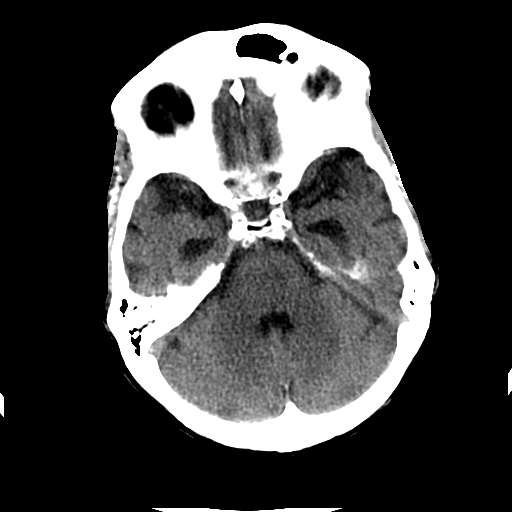
[im 9/29  brain]
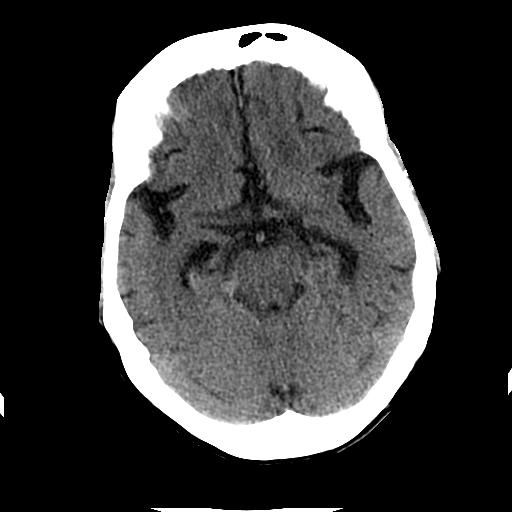
[im 11/29  brain]
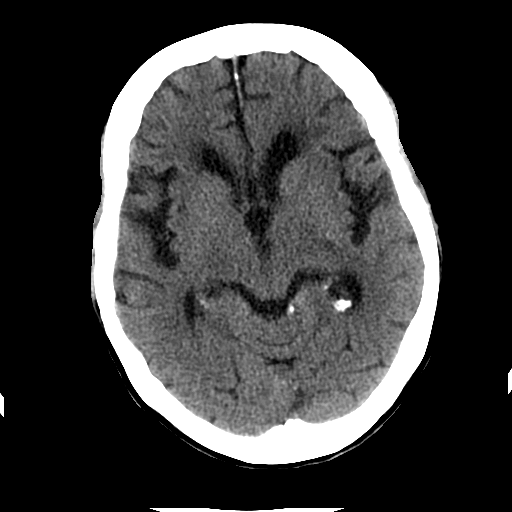
[im 11/29  bone]
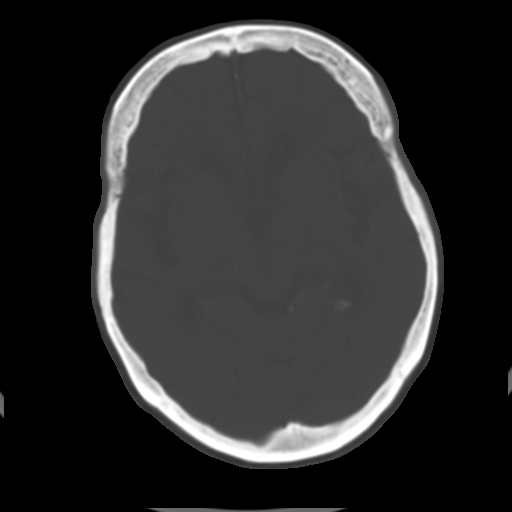
[im 13/29  brain]
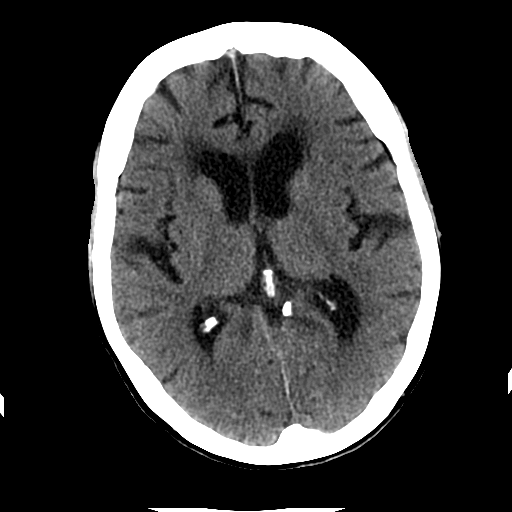
[im 17/29  brain]
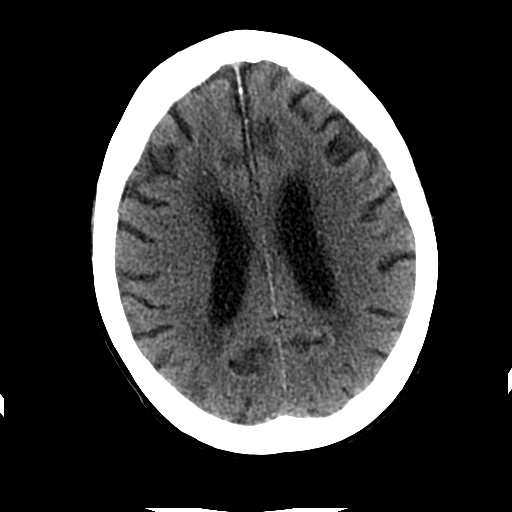
[im 19/29  brain]
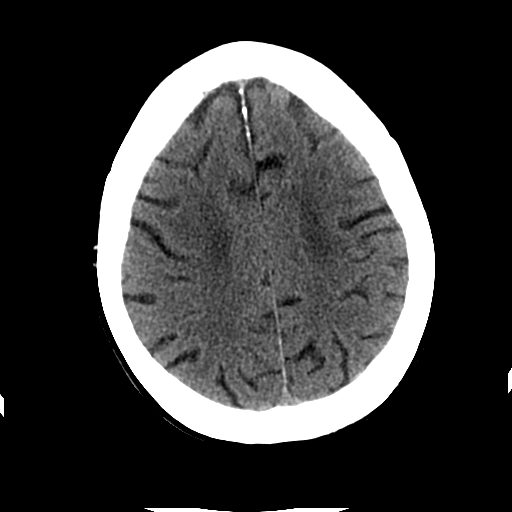
[im 21/29  brain]
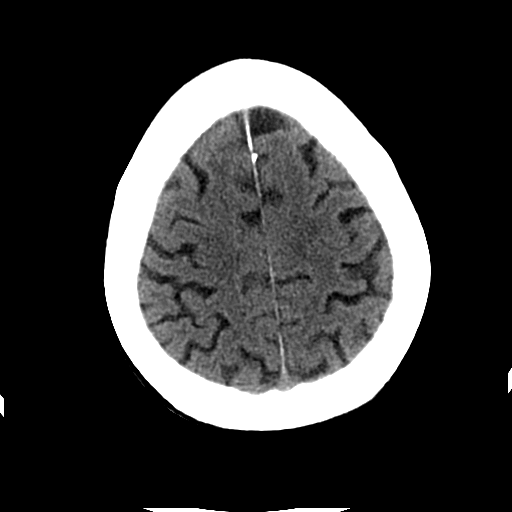
[im 21/29  bone]
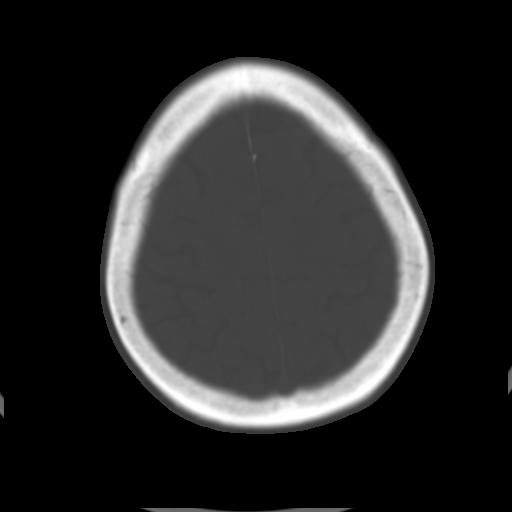
[im 23/29  brain]
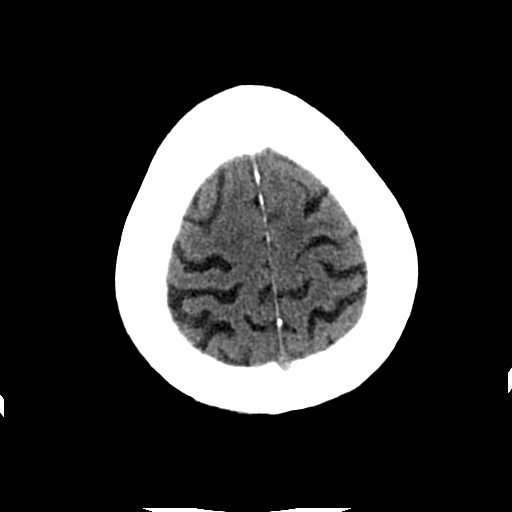
[im 25/29  brain]
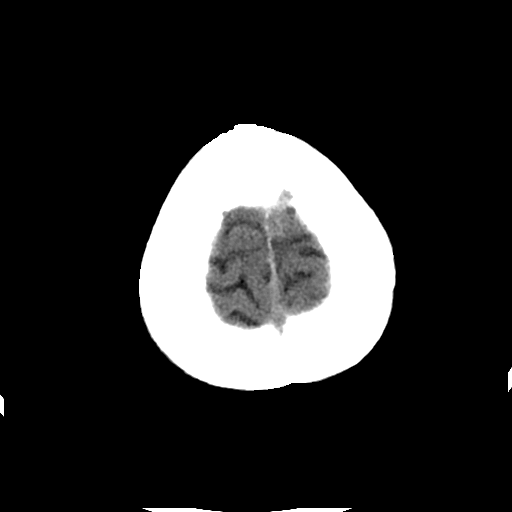
[im 27/29  brain]
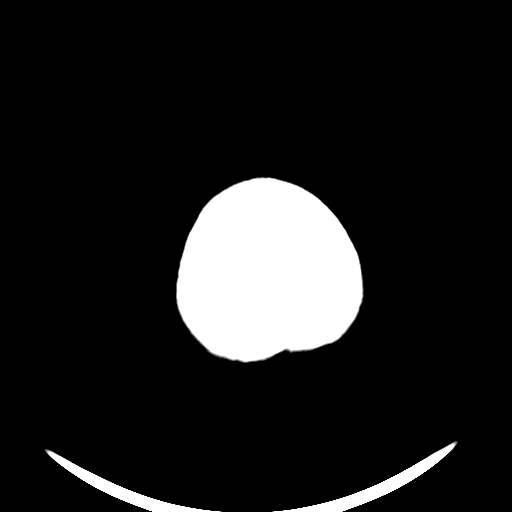

[Series 203: coronal st, idose (1) · coronal · 0.40mm/px · 3 of 63 slices shown]
[im 21/63  brain]
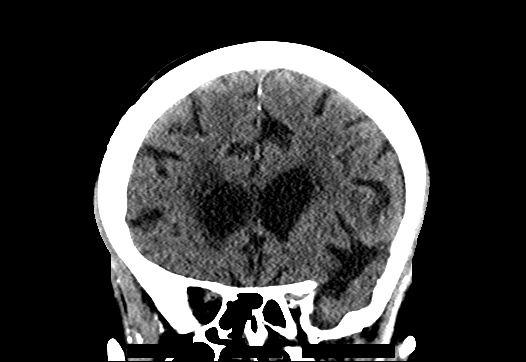
[im 28/63  brain]
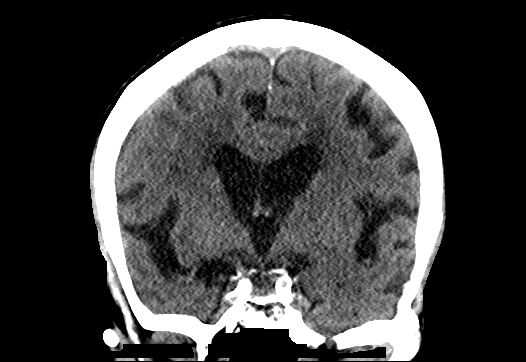
[im 35/63  brain]
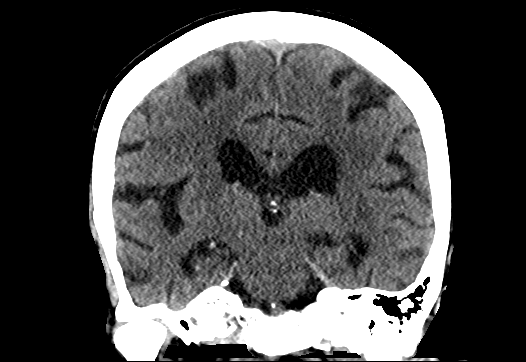

[Series 204: sagittal st, idose (1) · sagittal · 0.40mm/px · 3 of 50 slices shown]
[im 17/50  brain]
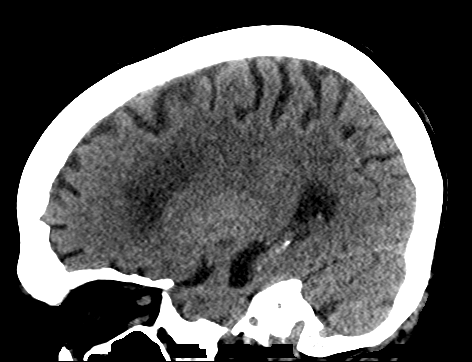
[im 25/50  brain]
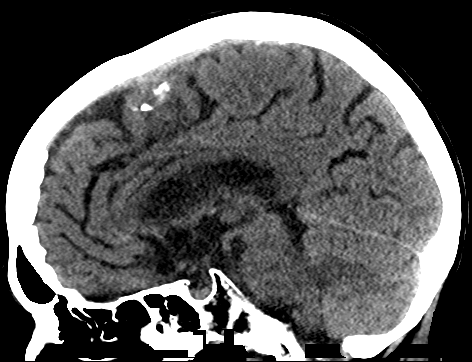
[im 33/50  brain]
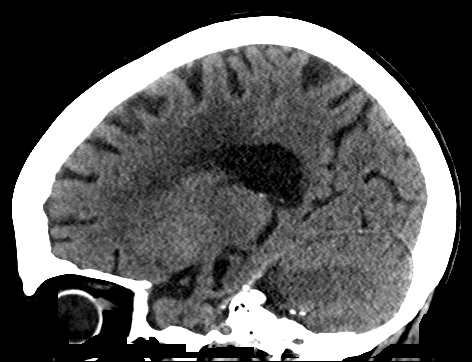

[18 of 47 positions shown; findings below may reference images not displayed]

FINDINGS: Brain: Mild age related volume loss. Mild chronic small vessel
disease throughout the deep white matter. No acute intracranial
abnormality. Specifically, no hemorrhage, hydrocephalus, mass
lesion, acute infarction, or significant intracranial injury.

Vascular: No hyperdense vessel or unexpected calcification.

Skull: No acute calvarial abnormality.

Sinuses/Orbits: Visualized paranasal sinuses and mastoids clear.
Orbital soft tissues unremarkable.

Other: None
IMPRESSION: No acute intracranial abnormality.
